# Patient Record
Sex: Female | Born: 1975 | Race: White | Hispanic: No | Marital: Single | Smoking: Current every day smoker
Health system: Southern US, Community
[De-identification: ages and names within clinical notes are randomized; demographics above are authoritative.]

## PROBLEM LIST (undated history)

## (undated) DIAGNOSIS — F1910 Other psychoactive substance abuse, uncomplicated: Secondary | ICD-10-CM

## (undated) DIAGNOSIS — G8929 Other chronic pain: Secondary | ICD-10-CM

## (undated) DIAGNOSIS — K8020 Calculus of gallbladder without cholecystitis without obstruction: Secondary | ICD-10-CM

## (undated) DIAGNOSIS — J449 Chronic obstructive pulmonary disease, unspecified: Secondary | ICD-10-CM

## (undated) DIAGNOSIS — N179 Acute kidney failure, unspecified: Secondary | ICD-10-CM

## (undated) DIAGNOSIS — F1120 Opioid dependence, uncomplicated: Secondary | ICD-10-CM

## (undated) DIAGNOSIS — I10 Essential (primary) hypertension: Secondary | ICD-10-CM

## (undated) HISTORY — PX: ABDOMINAL HYSTERECTOMY: SHX81

## (undated) HISTORY — PX: TUBAL LIGATION: SHX77

## (undated) HISTORY — PX: MOUTH SURGERY: SHX715

---

## 2001-07-24 ENCOUNTER — Other Ambulatory Visit: Admission: RE | Admit: 2001-07-24 | Discharge: 2001-07-24 | Payer: Self-pay | Admitting: *Deleted

## 2002-01-15 ENCOUNTER — Emergency Department (HOSPITAL_COMMUNITY): Admission: EM | Admit: 2002-01-15 | Discharge: 2002-01-15 | Payer: Self-pay | Admitting: Emergency Medicine

## 2002-04-23 ENCOUNTER — Emergency Department (HOSPITAL_COMMUNITY): Admission: EM | Admit: 2002-04-23 | Discharge: 2002-04-23 | Payer: Self-pay | Admitting: *Deleted

## 2002-05-13 ENCOUNTER — Emergency Department (HOSPITAL_COMMUNITY): Admission: EM | Admit: 2002-05-13 | Discharge: 2002-05-13 | Payer: Self-pay | Admitting: *Deleted

## 2002-05-20 ENCOUNTER — Emergency Department (HOSPITAL_COMMUNITY): Admission: EM | Admit: 2002-05-20 | Discharge: 2002-05-20 | Payer: Self-pay | Admitting: *Deleted

## 2002-07-22 ENCOUNTER — Emergency Department (HOSPITAL_COMMUNITY): Admission: EM | Admit: 2002-07-22 | Discharge: 2002-07-22 | Payer: Self-pay | Admitting: Emergency Medicine

## 2002-07-24 ENCOUNTER — Inpatient Hospital Stay (HOSPITAL_COMMUNITY): Admission: RE | Admit: 2002-07-24 | Discharge: 2002-07-25 | Payer: Self-pay | Admitting: *Deleted

## 2002-07-30 ENCOUNTER — Emergency Department (HOSPITAL_COMMUNITY): Admission: EM | Admit: 2002-07-30 | Discharge: 2002-07-30 | Payer: Self-pay | Admitting: *Deleted

## 2002-08-05 ENCOUNTER — Emergency Department (HOSPITAL_COMMUNITY): Admission: EM | Admit: 2002-08-05 | Discharge: 2002-08-05 | Payer: Self-pay | Admitting: Emergency Medicine

## 2002-08-10 ENCOUNTER — Emergency Department (HOSPITAL_COMMUNITY): Admission: EM | Admit: 2002-08-10 | Discharge: 2002-08-10 | Payer: Self-pay | Admitting: Emergency Medicine

## 2002-09-13 ENCOUNTER — Emergency Department (HOSPITAL_COMMUNITY): Admission: EM | Admit: 2002-09-13 | Discharge: 2002-09-14 | Payer: Self-pay | Admitting: Emergency Medicine

## 2002-09-25 ENCOUNTER — Emergency Department (HOSPITAL_COMMUNITY): Admission: EM | Admit: 2002-09-25 | Discharge: 2002-09-25 | Payer: Self-pay | Admitting: Emergency Medicine

## 2002-09-25 ENCOUNTER — Emergency Department (HOSPITAL_COMMUNITY): Admission: EM | Admit: 2002-09-25 | Discharge: 2002-09-25 | Payer: Self-pay | Admitting: *Deleted

## 2002-10-16 ENCOUNTER — Encounter: Admission: RE | Admit: 2002-10-16 | Discharge: 2003-01-14 | Payer: Self-pay

## 2002-11-09 ENCOUNTER — Emergency Department (HOSPITAL_COMMUNITY): Admission: EM | Admit: 2002-11-09 | Discharge: 2002-11-10 | Payer: Self-pay

## 2002-11-27 ENCOUNTER — Emergency Department (HOSPITAL_COMMUNITY): Admission: EM | Admit: 2002-11-27 | Discharge: 2002-11-27 | Payer: Self-pay | Admitting: *Deleted

## 2002-12-10 ENCOUNTER — Emergency Department (HOSPITAL_COMMUNITY): Admission: EM | Admit: 2002-12-10 | Discharge: 2002-12-10 | Payer: Self-pay | Admitting: Emergency Medicine

## 2003-01-08 ENCOUNTER — Emergency Department (HOSPITAL_COMMUNITY): Admission: EM | Admit: 2003-01-08 | Discharge: 2003-01-09 | Payer: Self-pay | Admitting: Emergency Medicine

## 2003-01-21 ENCOUNTER — Encounter: Admission: RE | Admit: 2003-01-21 | Discharge: 2003-01-21 | Payer: Self-pay | Admitting: Internal Medicine

## 2003-01-21 ENCOUNTER — Encounter: Payer: Self-pay | Admitting: Internal Medicine

## 2003-03-17 ENCOUNTER — Emergency Department (HOSPITAL_COMMUNITY): Admission: EM | Admit: 2003-03-17 | Discharge: 2003-03-17 | Payer: Self-pay | Admitting: Emergency Medicine

## 2003-03-27 ENCOUNTER — Emergency Department (HOSPITAL_COMMUNITY): Admission: EM | Admit: 2003-03-27 | Discharge: 2003-03-27 | Payer: Self-pay | Admitting: *Deleted

## 2003-04-30 ENCOUNTER — Emergency Department (HOSPITAL_COMMUNITY): Admission: EM | Admit: 2003-04-30 | Discharge: 2003-04-30 | Payer: Self-pay | Admitting: Emergency Medicine

## 2003-05-02 ENCOUNTER — Emergency Department (HOSPITAL_COMMUNITY): Admission: EM | Admit: 2003-05-02 | Discharge: 2003-05-02 | Payer: Self-pay | Admitting: Emergency Medicine

## 2003-05-02 ENCOUNTER — Encounter: Payer: Self-pay | Admitting: Emergency Medicine

## 2003-05-11 ENCOUNTER — Emergency Department (HOSPITAL_COMMUNITY): Admission: EM | Admit: 2003-05-11 | Discharge: 2003-05-11 | Payer: Self-pay | Admitting: Emergency Medicine

## 2003-05-20 ENCOUNTER — Encounter: Payer: Self-pay | Admitting: Orthopedic Surgery

## 2003-05-20 ENCOUNTER — Ambulatory Visit: Admission: RE | Admit: 2003-05-20 | Discharge: 2003-05-20 | Payer: Self-pay | Admitting: Orthopedic Surgery

## 2003-05-21 ENCOUNTER — Encounter: Payer: Self-pay | Admitting: Orthopedic Surgery

## 2003-10-15 ENCOUNTER — Inpatient Hospital Stay (HOSPITAL_COMMUNITY): Admission: EM | Admit: 2003-10-15 | Discharge: 2003-10-18 | Payer: Self-pay | Admitting: Psychiatry

## 2003-10-21 ENCOUNTER — Emergency Department (HOSPITAL_COMMUNITY): Admission: EM | Admit: 2003-10-21 | Discharge: 2003-10-21 | Payer: Self-pay | Admitting: *Deleted

## 2004-02-18 ENCOUNTER — Inpatient Hospital Stay (HOSPITAL_COMMUNITY): Admission: RE | Admit: 2004-02-18 | Discharge: 2004-02-19 | Payer: Self-pay | Admitting: *Deleted

## 2004-05-03 ENCOUNTER — Emergency Department (HOSPITAL_COMMUNITY): Admission: EM | Admit: 2004-05-03 | Discharge: 2004-05-03 | Payer: Self-pay | Admitting: Emergency Medicine

## 2004-05-22 ENCOUNTER — Emergency Department (HOSPITAL_COMMUNITY): Admission: EM | Admit: 2004-05-22 | Discharge: 2004-05-22 | Payer: Self-pay | Admitting: Emergency Medicine

## 2004-11-28 ENCOUNTER — Emergency Department (HOSPITAL_COMMUNITY): Admission: EM | Admit: 2004-11-28 | Discharge: 2004-11-28 | Payer: Self-pay | Admitting: Emergency Medicine

## 2005-01-11 ENCOUNTER — Emergency Department: Payer: Self-pay | Admitting: Emergency Medicine

## 2005-02-19 ENCOUNTER — Emergency Department (HOSPITAL_COMMUNITY): Admission: EM | Admit: 2005-02-19 | Discharge: 2005-02-19 | Payer: Self-pay | Admitting: *Deleted

## 2006-05-30 ENCOUNTER — Emergency Department (HOSPITAL_COMMUNITY): Admission: EM | Admit: 2006-05-30 | Discharge: 2006-05-31 | Payer: Self-pay | Admitting: Emergency Medicine

## 2006-07-04 ENCOUNTER — Emergency Department (HOSPITAL_COMMUNITY): Admission: EM | Admit: 2006-07-04 | Discharge: 2006-07-04 | Payer: Self-pay | Admitting: Emergency Medicine

## 2006-07-08 ENCOUNTER — Emergency Department (HOSPITAL_COMMUNITY): Admission: EM | Admit: 2006-07-08 | Discharge: 2006-07-08 | Payer: Self-pay | Admitting: Emergency Medicine

## 2007-06-15 ENCOUNTER — Encounter: Admission: RE | Admit: 2007-06-15 | Discharge: 2007-06-15 | Payer: Self-pay | Admitting: Otolaryngology

## 2007-06-20 ENCOUNTER — Ambulatory Visit (HOSPITAL_BASED_OUTPATIENT_CLINIC_OR_DEPARTMENT_OTHER): Admission: RE | Admit: 2007-06-20 | Discharge: 2007-06-20 | Payer: Self-pay | Admitting: Otolaryngology

## 2008-12-17 ENCOUNTER — Encounter: Payer: Self-pay | Admitting: Orthopedic Surgery

## 2008-12-17 ENCOUNTER — Emergency Department (HOSPITAL_COMMUNITY): Admission: EM | Admit: 2008-12-17 | Discharge: 2008-12-17 | Payer: Self-pay | Admitting: Emergency Medicine

## 2009-01-20 ENCOUNTER — Encounter: Admission: RE | Admit: 2009-01-20 | Discharge: 2009-01-20 | Payer: Self-pay | Admitting: Orthopaedic Surgery

## 2009-01-20 ENCOUNTER — Encounter: Payer: Self-pay | Admitting: Orthopedic Surgery

## 2009-03-13 ENCOUNTER — Encounter (HOSPITAL_COMMUNITY): Admission: RE | Admit: 2009-03-13 | Discharge: 2009-04-12 | Payer: Self-pay | Admitting: Orthopaedic Surgery

## 2009-07-24 ENCOUNTER — Encounter: Payer: Self-pay | Admitting: Orthopedic Surgery

## 2009-11-04 ENCOUNTER — Ambulatory Visit (HOSPITAL_COMMUNITY): Admission: RE | Admit: 2009-11-04 | Discharge: 2009-11-04 | Payer: Self-pay | Admitting: General Surgery

## 2009-11-18 ENCOUNTER — Ambulatory Visit: Payer: Self-pay | Admitting: Orthopedic Surgery

## 2009-11-18 DIAGNOSIS — M25569 Pain in unspecified knee: Secondary | ICD-10-CM | POA: Insufficient documentation

## 2010-07-22 ENCOUNTER — Emergency Department (HOSPITAL_COMMUNITY): Admission: EM | Admit: 2010-07-22 | Discharge: 2010-07-22 | Payer: Self-pay | Admitting: Emergency Medicine

## 2010-08-02 ENCOUNTER — Ambulatory Visit: Admission: RE | Admit: 2010-08-02 | Discharge: 2010-08-02 | Payer: Self-pay | Admitting: Neurology

## 2010-11-07 ENCOUNTER — Encounter: Payer: Self-pay | Admitting: Internal Medicine

## 2010-11-07 ENCOUNTER — Encounter: Payer: Self-pay | Admitting: General Surgery

## 2010-11-08 ENCOUNTER — Encounter: Payer: Self-pay | Admitting: Neurology

## 2010-11-08 ENCOUNTER — Encounter: Payer: Self-pay | Admitting: *Deleted
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
## 2010-11-17 NOTE — Progress Notes (Signed)
Summary: Progress note  Progress note   Imported By: Jacklynn Ganong 11/18/2009 09:46:45  _____________________________________________________________________  External Attachment:    Type:   Image     Comment:   External Document

## 2010-11-17 NOTE — Letter (Signed)
Summary: History form  History form   Imported By: Jacklynn Ganong 11/21/2009 08:38:53  _____________________________________________________________________  External Attachment:    Type:   Image     Comment:   External Document

## 2010-12-22 ENCOUNTER — Other Ambulatory Visit: Payer: Self-pay | Admitting: Neurology

## 2010-12-22 DIAGNOSIS — M545 Low back pain: Secondary | ICD-10-CM

## 2010-12-25 ENCOUNTER — Inpatient Hospital Stay: Admission: RE | Admit: 2010-12-25 | Payer: Self-pay | Source: Ambulatory Visit

## 2010-12-30 LAB — DIFFERENTIAL
Eosinophils Absolute: 0.2 10*3/uL (ref 0.0–0.7)
Eosinophils Relative: 2 % (ref 0–5)
Lymphocytes Relative: 42 % (ref 12–46)
Lymphs Abs: 2.9 10*3/uL (ref 0.7–4.0)
Monocytes Absolute: 0.5 10*3/uL (ref 0.1–1.0)

## 2010-12-30 LAB — POCT CARDIAC MARKERS
CKMB, poc: 2.3 ng/mL (ref 1.0–8.0)
Myoglobin, poc: 79.1 ng/mL (ref 12–200)
Troponin i, poc: <0.05 ng/mL (ref 0.00–0.09)
Troponin i, poc: <0.05 ng/mL (ref 0.00–0.09)

## 2010-12-30 LAB — BASIC METABOLIC PANEL
BUN: 7 mg/dL (ref 6–23)
CO2: 28 mEq/L (ref 19–32)
Chloride: 105 mEq/L (ref 96–112)
GFR calc Af Amer: >60 mL/min (ref >60)
Potassium: 3.7 mEq/L (ref 3.5–5.1)

## 2010-12-30 LAB — CBC
HCT: 38.2 % (ref 36.0–46.0)
MCH: 29.5 pg (ref 26.0–34.0)
MCV: 87.2 fL (ref 78.0–100.0)
RBC: 4.38 MIL/uL (ref 3.87–5.11)
WBC: 6.9 10*3/uL (ref 4.0–10.5)

## 2010-12-31 ENCOUNTER — Emergency Department (HOSPITAL_COMMUNITY)
Admission: EM | Admit: 2010-12-31 | Discharge: 2010-12-31 | Disposition: A | Discharge status: Home / Self Care | Payer: Medicaid Other | Attending: Emergency Medicine | Admitting: Emergency Medicine

## 2010-12-31 ENCOUNTER — Emergency Department (HOSPITAL_COMMUNITY): Payer: Medicaid Other

## 2010-12-31 DIAGNOSIS — I10 Essential (primary) hypertension: Secondary | ICD-10-CM | POA: Insufficient documentation

## 2010-12-31 DIAGNOSIS — F411 Generalized anxiety disorder: Secondary | ICD-10-CM | POA: Insufficient documentation

## 2010-12-31 DIAGNOSIS — Z79899 Other long term (current) drug therapy: Secondary | ICD-10-CM | POA: Insufficient documentation

## 2010-12-31 DIAGNOSIS — J4489 Other specified chronic obstructive pulmonary disease: Secondary | ICD-10-CM | POA: Insufficient documentation

## 2010-12-31 DIAGNOSIS — S0003XA Contusion of scalp, initial encounter: Secondary | ICD-10-CM | POA: Insufficient documentation

## 2010-12-31 DIAGNOSIS — S0990XA Unspecified injury of head, initial encounter: Secondary | ICD-10-CM | POA: Insufficient documentation

## 2010-12-31 DIAGNOSIS — J449 Chronic obstructive pulmonary disease, unspecified: Secondary | ICD-10-CM | POA: Insufficient documentation

## 2011-03-02 NOTE — Op Note (Signed)
NAMEARDELLE, HALIBURTON               ACCOUNT NO.:  [id number]   MEDICAL RECORD NO.:  [id number]          PATIENT TYPE:  AMB   LOCATION:  DSC                          FACILITY:  [facility]   PHYSICIAN:  Christopher E. Ezzard Standing, M.D.DATE OF BIRTH:  [birth year]   DATE OF PROCEDURE:  06/20/2007  DATE OF DISCHARGE:                               OPERATIVE REPORT   PREOPERATIVE DIAGNOSIS:  Secondary bilateral choanal atresia.   POSTOPERATIVE DIAGNOSIS:  Secondary bilateral choanal atresia.   OPERATION:  Repair of choanal atresia with placement of Silastic splint.   SURGEON:  Narda Bonds, MD   ANESTHESIA:  General endotracheal.   COMPLICATIONS:  None.   BRIEF CLINICAL NOTE:  Nancy Lynn is a 35 year old female who has had  history of significant drug abuse, especially in the nose where she has  used cocaine as well Tylox intranasally.  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  She has had chronic mucus drainage from  her nose.  She was attempted to have a nasal airway placed while having  some dental procedures done and had no nasal airway.  On exam in the  office, she has complete atresia of the choanae bilaterally with scar  tissue over the soft palate, stuck up against the nasopharyngeal soft  tissue.  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  She  is taken to the operating room at this time for repair of choanal  atresia and placement of a splint to keep this open.   DESCRIPTION OF PROCEDURE:  After adequate endotracheal anesthesia, the  patient received 1 g of Ancef IV preoperatively.  Next, the 0 sinus  scope was used to evaluate the nasal cavity.  The patient had total  obstruction of the posterior nasal cavity in the choanal area.  There  was just scar tissue.  Using a large right-angle hemostat, this was  placed behind the uvula and soft palate and a passageway was created  with the right-angle hemostat.  This was enlarged with blunt dissection  as  well as passing a rubber catheter through there and dilating the  opening.  Finally, using cautery, a portion of the scar tissue was cut  and removed and the choanae were opened up to approximately 2 to 2.5-cm-  wide opening.  Hemostasis was obtained with suction cautery.  At the end  of the case, a 0.4-mm sheeting of reinforced Silastic was placed, cut to  appropriate size and placed in the nasopharynx; this was secured to the  septum with two 3-0 nylon sutures.  This completed the procedure.  Photos were obtained.  The patient was awoken from anesthesia and  transferred to the recovery room postop doing well.   DISPOSITION:  Kerby is discharged home later this morning on her  regular medications as well as Tylenol p.r.n. pain and amoxicillin 875  b.i.d. for 10 days.  We will have her follow up in my office in 1 week  for recheck.          ______________________________  Kristine Garbe. Ezzard Standing, M.D.    CEN/MEDQ  D:  06/20/2007  T:  06/20/2007  Job:  [id number]
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
## 2011-03-05 NOTE — Consult Note (Signed)
Nancy Lynn, Nancy Lynn                           ACCOUNT NO.:  [id number]   MEDICAL RECORD NO.:  [id number]                   PATIENT TYPE:   LOCATION:                                       FACILITY:   PHYSICIAN:  Zachary George, DO                      DATE OF BIRTH:  11-Mar-1976   DATE OF CONSULTATION:  11/02/2002  DATE OF DISCHARGE:                                   CONSULTATION   REASON FOR CONSULTATION:  The patient returns to the clinic today for re-  evaluation.  She was initially seen on October 19, 2002 for chronic lower  back pain.  She was prescribed Tylox 5/500 mg to take three times a day as  needed, and I gave her a prescription for 60 pills.  She has already used up  all of her pills and in addition she has received Oxycodone four pills from  her primary care physician which is in violation with our controlled  substance agreement.  She is counseled extensively on our controlled  substance agreement that she had signed at initial visit.  In addition, I  had ordered an MRI of her lumbar spine and recommended physical therapy and  gave her a prescription.  She has neither gotten her MRI nor begun physical  therapy stating that her grandfather is in the hospital and her kids have a  lot going on.  She also continues to smoke one and a half packs of  cigarettes per day.  She has fairly poor health characteristics and I  counseled her on these as well.  In addition, I obtained a urine drug screen  at initial visit which was positive for propoxyphene which the patient had  not disclosed.  She states that she had gotten some Darvocet from her  grandmother for tooth pain.  Currently, her pain is 9/10 on a subjective  scale.  Her function of quality of life indices remain declined. Her sleep  is poor.  She was unable to tolerate Mobic stating that it made her sick to  her stomach.  She is apologetic about not getting her MRI and starting  physical therapy.  However, she asks for more  time to get this done  secondary to all of the things that have been going on in her life recently.  In addition, she awaits getting her teeth pulled.   PHYSICAL EXAMINATION:  GENERAL:  Physical examination reveals an obese  female in no acute distress.  BACK:  Examination of other back reveals increased lumbar lordosis with  slightly exaggerated response to very light palpation of the lumbar  paraspinal muscles.  Range of motion is full in all planes.  Manual muscle  testing is 5/5 bilateral lower extremities.  Sensory exam is intact to light  touch in the bilateral lower extremities.  Muscle stretch reflexes are 2+/4  bilateral patella and  Achilles and 0/4 bilateral medial hamstrings.   IMPRESSION:  1. Chronic low back pain, mechanical and myofascial.  2. Obesity.  3. Tobacco abuse.  4. Poor health characteristics.   PLAN:  1. I discussed further treatment options with the patient.  She has been non-     compliant over the past two weeks with the treatment plan.  She has been     non-complaint not only with her medications but also with therapy and     further diagnostic workup.  She has been counseled in regards to the     controlled substance agreement as well as using other people's scheduled     medications.  I informed her that I am unable to refill her medications     early and that she may ultimately be discharged from our clinic if she     continues to violate the controlled substance agreement and continues     with her non-compliance.  I would like for her to begin physical therapy     and get her MRI of the lumbar spine.  2. In terms of medications, we will start Naprosyn 500 mg one p.o. b.i.d.     with food, #60 without refills.  3. While she is going through therapy we will continue with Tylox 5/500 mg     one p.o. t.i.d. as needed, #60 without refills.  This is not to be filled     until on or after November 07, 2002, the date at which her previous     prescription  would have run out.  I have counseled her extensively on     following with the plan and not to take more medications than prescribed.     I do not see long term use of narcotics warranted in this 35 year-old     female.  4. I counseled her on positive health characteristics and encouraged her to     quit smoking and lose weight for best outcome.  5. We will obtain a urine drug screen with a full informed consent.  6. The patient is to return to the clinic in three weeks for re-evaluation.  7. The patient was educated about the findings and recommendations and     understands.  No barriers to communication.                                               Zachary George, DO    JW/MEDQ  D:  11/02/2002  T:  11/03/2002  Job:  [id number]   cc:   Elliot Cousin, M.D.  [address]  [place]  [place] [address]  Fax: [phone]

## 2011-03-05 NOTE — Discharge Summary (Signed)
Nancy Lynn, Nancy Lynn                         ACCOUNT NO.:  [id number]   MEDICAL RECORD NO.:  [id number]                   PATIENT TYPE:  INP   LOCATION:  A404                                 FACILITY:  [facility]   PHYSICIAN:  Langley Gauss, M.D.                DATE OF BIRTH:  24-Sep-1976   DATE OF ADMISSION:  02/18/2004  DATE OF DISCHARGE:  02/19/2004                                 DISCHARGE SUMMARY   DIAGNOSES:  1. Chronic pelvic pain, right greater than left.  2. History of prior total abdominal hysterectomy.   PROCEDURE PERFORMED:  Laparoscopy with lysis of adhesions.  I was unable to  remove the ovaries secondary to the extensive adhesion disease encountered,  and the likely retro-peritonization of the adnexa.   DISPOSITION:  The patient was given instructions to follow up in the office  in one week's time for removal of the staples from the laparoscopic  incisions.   DISCHARGE MEDICATIONS:  Tylox two every four to six hours p.r.n.  postoperative pain, #60 with no refill.   LABORATORY STUDIES:  Admission hemoglobin and hematocrit 14.2/42.5 with a  white count of 11.3.  Postoperative day #1, hemoglobin 12.8, and hematocrit  37.6 with a white count of 12.5, O positive blood type.  FSH is ordered for  Feb 19, 2004.  Results of this are not available.   HOSPITAL COURSE:  The patient was referred through the ambulatory surgical  unit on Feb 18, 2003.  Laparoscopic procedure performed without  complications.  Extensive adhesive disease encountered.  Three hours of  dissection required; nevertheless, visualization of the adnexa was not  possible.  Postoperatively, the patient did well.  She did request removal  of the Foley catheter on the afternoon of surgery.  Subsequently she was  able to ambulate and void without difficulty.  She remained afebrile with  stable vital signs.  She was treated with IV Buprenex for pain relief as  well as p.o. Tylox.   Operative findings were  discussed with the patient at the time of discharge  on Feb 19, 2004, to include the difficulty of the dissection, and the  inability to safely excise the ovaries.  I did discuss with the patient that  should removal of ovaries bilaterally be required in the future, this would  need to be done by proceeding with open laparotomy.     ___________________________________________                                         Langley Gauss, M.D.   DC/MEDQ  D:  02/19/2004  T:  02/19/2004  Job:  [id number]

## 2011-07-30 LAB — CBC
MCHC: 34.3
MCV: 84
Platelets: 253
RDW: 13.1

## 2011-09-07 ENCOUNTER — Emergency Department (HOSPITAL_COMMUNITY)
Admission: EM | Admit: 2011-09-07 | Discharge: 2011-09-07 | Disposition: A | Discharge status: Home / Self Care | Payer: Medicaid Other | Attending: Emergency Medicine | Admitting: Emergency Medicine

## 2011-09-07 ENCOUNTER — Encounter: Payer: Self-pay | Admitting: *Deleted

## 2011-09-07 DIAGNOSIS — M25569 Pain in unspecified knee: Secondary | ICD-10-CM | POA: Insufficient documentation

## 2011-09-07 DIAGNOSIS — M25561 Pain in right knee: Secondary | ICD-10-CM

## 2011-09-07 DIAGNOSIS — Z6841 Body Mass Index (BMI) 40.0 and over, adult: Secondary | ICD-10-CM | POA: Insufficient documentation

## 2011-09-07 DIAGNOSIS — F17200 Nicotine dependence, unspecified, uncomplicated: Secondary | ICD-10-CM | POA: Insufficient documentation

## 2011-09-07 DIAGNOSIS — Z9079 Acquired absence of other genital organ(s): Secondary | ICD-10-CM | POA: Insufficient documentation

## 2011-09-07 DIAGNOSIS — J4489 Other specified chronic obstructive pulmonary disease: Secondary | ICD-10-CM | POA: Insufficient documentation

## 2011-09-07 DIAGNOSIS — I10 Essential (primary) hypertension: Secondary | ICD-10-CM | POA: Insufficient documentation

## 2011-09-07 DIAGNOSIS — J449 Chronic obstructive pulmonary disease, unspecified: Secondary | ICD-10-CM | POA: Insufficient documentation

## 2011-09-07 HISTORY — DX: Chronic obstructive pulmonary disease, unspecified: J44.9

## 2011-09-07 HISTORY — DX: Essential (primary) hypertension: I10

## 2011-09-07 MED ORDER — OXYCODONE-ACETAMINOPHEN 5-325 MG PO TABS
2.0000 | ORAL_TABLET | Freq: Once | ORAL | Status: AC
  Administered 2011-09-07: 2 via ORAL
  Filled 2011-09-07: qty 2

## 2011-09-07 MED ORDER — NAPROXEN 250 MG PO TABS
500.0000 mg | ORAL_TABLET | Freq: Once | ORAL | Status: AC
  Administered 2011-09-07: 500 mg via ORAL
  Filled 2011-09-07: qty 2

## 2011-09-07 MED ORDER — OXYCODONE-ACETAMINOPHEN 5-325 MG PO TABS: 1.0000 | ORAL_TABLET | ORAL | Status: AC | PRN

## 2011-09-07 NOTE — ED Provider Notes (Signed)
History     CSN: [id number] Arrival date & time: 09/07/2011  8:18 PM   First MD Initiated Contact with Patient 09/07/11 2026      Chief Complaint  Patient presents with  . Knee Pain    (Consider location/radiation/quality/duration/timing/severity/associated sxs/prior treatment) HPI Comments: Patient with hx of chronic bilateral knee pain c/o increasing pain for several weeks.  States she has seen Dr. Romeo Apple for same and was told that she needed knee replacements but would need to lose weight first.  States her pain is worse with standing and bending her knees.  Also reports having intermittent swelling of her knees.  Pain improves slightly with rest.  She denies recent injury, redness or fever.  She states that her current pain medications are not controlling the pain.    Patient is a 35 y.o. female presenting with knee pain. The history is provided by the patient.  Knee Pain This is a chronic problem. The current episode started 1 to 4 weeks ago. The problem occurs constantly. The problem has been gradually worsening. Associated symptoms include arthralgias. Pertinent negatives include no abdominal pain, chest pain, chills, coughing, fatigue, fever, joint swelling, myalgias, nausea, neck pain, numbness, rash, sore throat, swollen glands, urinary symptoms, vomiting or weakness. The symptoms are aggravated by walking and standing. Treatments tried: her own medications. The treatment provided no relief.    Past Medical History  Diagnosis Date  . COPD (chronic obstructive pulmonary disease)   . Hypertension     Past Surgical History  Procedure Date  . Cesarean section   . Abdominal hysterectomy   . Tubal ligation     Family History  Problem Relation Age of Onset  . Diabetes Mother   . Hypertension Father     History  Substance Use Topics  . Smoking status: Current Everyday Smoker -- 1.0 packs/day    Types: Cigarettes  . Smokeless tobacco: Not on file  . Alcohol Use: Yes      OB History    Grav Para Term Preterm Abortions TAB SAB Ect Mult Living                  Review of Systems  Constitutional: Negative for fever, chills and fatigue.  HENT: Negative for sore throat, trouble swallowing, neck pain and neck stiffness.   Respiratory: Negative for cough, shortness of breath and wheezing.   Cardiovascular: Negative for chest pain and palpitations.  Gastrointestinal: Negative for nausea, vomiting and abdominal pain.  Genitourinary: Negative for dysuria, hematuria and flank pain.  Musculoskeletal: Positive for arthralgias. Negative for myalgias, back pain, joint swelling and gait problem.  Skin: Negative.  Negative for rash.  Neurological: Negative for dizziness, weakness and numbness.  Hematological: Does not bruise/bleed easily.  All other systems reviewed and are negative.    Allergies  Review of patient's allergies indicates no known allergies.  Home Medications   Current Outpatient Rx  Name Route Sig Dispense Refill  . ALBUTEROL SULFATE HFA 108 (90 BASE) MCG/ACT IN AERS Inhalation Inhale 2 puffs into the lungs daily as needed. For shortness of breath     . ALPRAZOLAM 1 MG PO TABS Oral Take 1 mg by mouth 3 (three) times daily.      Marland Kitchen DEXTROMETHORPHAN-GUAIFENESIN 30-600 MG PO TB12 Oral Take 1 tablet by mouth daily as needed. For congestion     . FLUTICASONE-SALMETEROL 250-50 MCG/DOSE IN AEPB Inhalation Inhale 1 puff into the lungs every 12 (twelve) hours.      . FUROSEMIDE 40  MG PO TABS Oral Take 40 mg by mouth daily.      Marland Kitchen GABAPENTIN 600 MG PO TABS Oral Take 600 mg by mouth 3 (three) times daily.      . IBUPROFEN 800 MG PO TABS Oral Take 800 mg by mouth every 8 (eight) hours as needed. For pain     . LISINOPRIL 20 MG PO TABS Oral Take 20 mg by mouth daily.      Marland Kitchen POTASSIUM CHLORIDE CRYS CR 20 MEQ PO TBCR Oral Take 20 mEq by mouth daily.      . TOPIRAMATE 100 MG PO TABS Oral Take 100 mg by mouth daily.      . TRAMADOL HCL 50 MG PO TABS Oral Take  50-100 mg by mouth every 6 (six) hours as needed. For painMaximum dose= 8 tablets per day       BP 136/88  Pulse 104  Temp(Src) 97.7 F (36.5 C) (Oral)  Resp 20  Ht 5\' 7"  (1.702 m)  Wt 315 lb (142.883 kg)  BMI 49.34 kg/m2  SpO2 100%  Physical Exam  Nursing note and vitals reviewed. Constitutional: She is oriented to person, place, and time. She appears well-developed and well-nourished. No distress.       Morbidly obese  HENT:  Head: Normocephalic and atraumatic.  Mouth/Throat: Oropharynx is clear and moist.  Cardiovascular: Normal rate, regular rhythm and normal heart sounds.   Pulmonary/Chest: Effort normal and breath sounds normal. No respiratory distress. She exhibits no tenderness.  Musculoskeletal: Normal range of motion. She exhibits tenderness. She exhibits no edema.       Right knee: She exhibits normal range of motion, no swelling, no effusion, no ecchymosis, no deformity, no laceration, no erythema, no LCL laxity and normal patellar mobility. tenderness found. No medial joint line, no lateral joint line and no patellar tendon tenderness noted.       Left knee: She exhibits normal range of motion, no swelling, no effusion, no ecchymosis, no deformity, no erythema and normal alignment. tenderness found. No medial joint line, no lateral joint line and no patellar tendon tenderness noted.       Moderate patellar crepitus bilaterally, patellar tendons intact.  Pt has full ROM of the bilateral knee, no effusion, or ligament instabilty  Neurological: She is alert and oriented to person, place, and time. She has normal reflexes. No cranial nerve deficit. She exhibits normal muscle tone. Coordination normal.  Skin: Skin is warm and dry.  Psychiatric: She has a normal mood and affect.    ED Course  Procedures (including critical care time)       MDM     9:13 PM patient has moderate crepitus to bilateral knees.  No effusion, erythema or obvious ligament instability.   Ambulated to restroom without difficulty.  I have reviewed pt's previous medical records.  According to previous orthopedic evaluation from 2011, pt was scheduled for PT and to return after weight loss.     I will address her pain issue and she agrees to follow-up with her PMD for further management of her chronic pain.    Pt feels improved after observation and/or treatment in ED.   Patient / Family / Caregiver understand and agree with initial ED impression and plan with expectations set for ED visit.     Kanetra Ho L. [place], [place] 09/07/11 2126

## 2011-09-07 NOTE — ED Notes (Signed)
Bil knee pain  , needs knee replacements.  Seen by Dr Romeo Apple.  Needs to lose wt prior to surgery

## 2011-09-08 NOTE — ED Provider Notes (Signed)
Medical screening examination/treatment/procedure(s) were performed by non-physician practitioner and as supervising physician I was immediately available for consultation/collaboration.   Ece Cumberland L Kirtan Sada, MD 09/08/11 2326 

## 2011-10-27 ENCOUNTER — Emergency Department (HOSPITAL_COMMUNITY): Payer: Medicaid Other

## 2011-10-27 ENCOUNTER — Emergency Department (HOSPITAL_COMMUNITY)
Admission: EM | Admit: 2011-10-27 | Discharge: 2011-10-27 | Disposition: A | Discharge status: Home / Self Care | Payer: Medicaid Other | Attending: Emergency Medicine | Admitting: Emergency Medicine

## 2011-10-27 ENCOUNTER — Encounter (HOSPITAL_COMMUNITY): Payer: Self-pay | Admitting: *Deleted

## 2011-10-27 DIAGNOSIS — J209 Acute bronchitis, unspecified: Secondary | ICD-10-CM | POA: Insufficient documentation

## 2011-10-27 DIAGNOSIS — F17200 Nicotine dependence, unspecified, uncomplicated: Secondary | ICD-10-CM | POA: Insufficient documentation

## 2011-10-27 DIAGNOSIS — I10 Essential (primary) hypertension: Secondary | ICD-10-CM | POA: Insufficient documentation

## 2011-10-27 DIAGNOSIS — Z9079 Acquired absence of other genital organ(s): Secondary | ICD-10-CM | POA: Insufficient documentation

## 2011-10-27 MED ORDER — IPRATROPIUM BROMIDE 0.02 % IN SOLN
0.5000 mg | Freq: Once | RESPIRATORY_TRACT | Status: AC
  Administered 2011-10-27: 0.5 mg via RESPIRATORY_TRACT
  Filled 2011-10-27: qty 2.5

## 2011-10-27 MED ORDER — PREDNISONE 20 MG PO TABS: 60.0000 mg | ORAL_TABLET | Freq: Every day | ORAL | Status: DC

## 2011-10-27 MED ORDER — OXYCODONE-ACETAMINOPHEN 5-325 MG PO TABS: 1.0000 | ORAL_TABLET | ORAL | Status: AC | PRN

## 2011-10-27 MED ORDER — ALBUTEROL SULFATE (5 MG/ML) 0.5% IN NEBU
5.0000 mg | INHALATION_SOLUTION | Freq: Once | RESPIRATORY_TRACT | Status: AC
  Administered 2011-10-27: 5 mg via RESPIRATORY_TRACT
  Filled 2011-10-27: qty 1

## 2011-10-27 MED ORDER — OXYCODONE-ACETAMINOPHEN 5-325 MG PO TABS
2.0000 | ORAL_TABLET | Freq: Once | ORAL | Status: AC
  Administered 2011-10-27: 2 via ORAL
  Filled 2011-10-27: qty 2

## 2011-10-27 MED ORDER — ALBUTEROL SULFATE (5 MG/ML) 0.5% IN NEBU: 5.0000 mg | INHALATION_SOLUTION | Freq: Four times a day (QID) | RESPIRATORY_TRACT | Status: DC | PRN

## 2011-10-27 MED ORDER — PREDNISONE 20 MG PO TABS
60.0000 mg | ORAL_TABLET | Freq: Once | ORAL | Status: AC
  Administered 2011-10-27: 60 mg via ORAL
  Filled 2011-10-27: qty 3

## 2011-10-27 NOTE — ED Notes (Signed)
Alert, frequent cough, congested.

## 2011-10-27 NOTE — ED Notes (Signed)
Cough, congestion, fever

## 2011-10-28 NOTE — ED Provider Notes (Signed)
History     CSN: [id number]  Arrival date & time 10/27/11  1227   First MD Initiated Contact with Patient 10/27/11 1359      Chief Complaint  Patient presents with  . Cough    (Consider location/radiation/quality/duration/timing/severity/associated sxs/prior treatment) Patient is a 36 y.o. female presenting with cough. The history is provided by the patient.  Cough This is a new problem. The current episode started more than 2 days ago. The problem occurs every few minutes. The problem has not changed since onset.The cough is productive of sputum. The maximum temperature recorded prior to her arrival was 100 to 100.9 F. The fever has been present for 1 to 2 days. Associated symptoms include chest pain, chills, rhinorrhea, shortness of breath and wheezing. Pertinent negatives include no headaches and no sore throat. Associated symptoms comments: Chest pain is burning pain when she coughs,  midsternal.. Treatments tried: albuterol inhaler and mucinex DM. The treatment provided mild relief. She is a smoker. Her past medical history is significant for bronchitis and asthma.    Past Medical History  Diagnosis Date  . COPD (chronic obstructive pulmonary disease)   . Hypertension     Past Surgical History  Procedure Date  . Cesarean section   . Abdominal hysterectomy   . Tubal ligation     Family History  Problem Relation Age of Onset  . Diabetes Mother   . Hypertension Father     History  Substance Use Topics  . Smoking status: Current Everyday Smoker -- 1.0 packs/day    Types: Cigarettes  . Smokeless tobacco: Not on file  . Alcohol Use: Yes    OB History    Grav Para Term Preterm Abortions TAB SAB Ect Mult Living                  Review of Systems  Constitutional: Positive for chills. Negative for fever.  HENT: Positive for rhinorrhea. Negative for congestion, sore throat and neck pain.   Eyes: Negative.   Respiratory: Positive for cough, shortness of breath and  wheezing. Negative for chest tightness.   Cardiovascular: Positive for chest pain.  Gastrointestinal: Negative for nausea and abdominal pain.  Genitourinary: Negative.   Musculoskeletal: Negative for joint swelling and arthralgias.  Skin: Negative.  Negative for rash and wound.  Neurological: Negative for dizziness, weakness, light-headedness, numbness and headaches.  Hematological: Negative.   Psychiatric/Behavioral: Negative.     Allergies  Latex  Home Medications   Current Outpatient Rx  Name Route Sig Dispense Refill  . ALBUTEROL SULFATE HFA 108 (90 BASE) MCG/ACT IN AERS Inhalation Inhale 2 puffs into the lungs daily as needed. For shortness of breath     . ALPRAZOLAM 1 MG PO TABS Oral Take 1 mg by mouth 3 (three) times daily.      Marland Kitchen DM-GUAIFENESIN ER 30-600 MG PO TB12 Oral Take 1 tablet by mouth daily as needed. For congestion     . FLUTICASONE-SALMETEROL 250-50 MCG/DOSE IN AEPB Inhalation Inhale 1 puff into the lungs every 12 (twelve) hours.      . FUROSEMIDE 40 MG PO TABS Oral Take 40 mg by mouth daily.      Marland Kitchen GABAPENTIN 600 MG PO TABS Oral Take 600 mg by mouth 3 (three) times daily.      . IBUPROFEN 800 MG PO TABS Oral Take 800 mg by mouth every 8 (eight) hours as needed. For pain     . LISINOPRIL 20 MG PO TABS Oral Take 20 mg  by mouth daily.      . OXYCODONE-ACETAMINOPHEN 5-325 MG PO TABS Oral Take 1 tablet by mouth every 4 (four) hours as needed. Pain    . POTASSIUM CHLORIDE CRYS ER 20 MEQ PO TBCR Oral Take 20 mEq by mouth daily.      . TRAMADOL HCL 50 MG PO TABS Oral Take 50-100 mg by mouth every 6 (six) hours as needed. For painMaximum dose= 8 tablets per day    . ALBUTEROL SULFATE (5 MG/ML) 0.5% IN NEBU Nebulization Take 1 mL (5 mg total) by nebulization every 6 (six) hours as needed for wheezing. 20 mL 0  . OXYCODONE-ACETAMINOPHEN 5-325 MG PO TABS Oral Take 1 tablet by mouth every 4 (four) hours as needed for pain. 20 tablet 0  . PREDNISONE 20 MG PO TABS Oral Take 3  tablets (60 mg total) by mouth daily. 12 tablet 0    BP 123/74  Pulse 84  Temp(Src) 97.8 F (36.6 C) (Oral)  Resp 22  Ht 5\' 7"  (1.702 m)  Wt 321 lb (145.605 kg)  BMI 50.28 kg/m2  SpO2 96%  Physical Exam  Nursing note and vitals reviewed. Constitutional: She is oriented to person, place, and time. She appears well-developed and well-nourished.  HENT:  Head: Normocephalic and atraumatic.  Mouth/Throat: Oropharynx is clear and moist.  Eyes: Conjunctivae are normal.  Neck: Normal range of motion.  Cardiovascular: Normal rate, regular rhythm, normal heart sounds and intact distal pulses.   Pulmonary/Chest: She is in respiratory distress. She has wheezes.       Frequent coarse coughing.  Wheezing throughout   Abdominal: Soft. Bowel sounds are normal. There is no tenderness.  Musculoskeletal: Normal range of motion.  Neurological: She is alert and oriented to person, place, and time.  Skin: Skin is warm and dry.  Psychiatric: She has a normal mood and affect.    ED Course  Procedures (including critical care time)  Labs Reviewed - No data to display Dg Chest 2 View  10/27/2011  *RADIOLOGY REPORT*  Clinical Data: Cough and congestion.  History of COPD.  CHEST - 2 VIEW  Comparison: PA and lateral chest 06/15/2007 and single view of the chest 07/22/2010.  Findings: The lungs are clear. Peribronchial thickening is identified.  Heart size is normal.  No pneumothorax or pleural effusion.  No focal bony abnormality.  IMPRESSION: No acute disease.  Bronchitic change noted.  Original Report Authenticated By: Bernadene Bell. D'ALESSIO, M.D.     1. Bronchitis, acute, with bronchospasm     Pt tx with albuteral/atrovent neb.  Prednisone 60 PO.  Significant improvement in wheeze and cough at dc.  Percocet for cough suppression.  MDM          Candis Musa, PA 10/28/11 2314

## 2011-10-29 NOTE — ED Provider Notes (Signed)
Medical screening examination/treatment/procedure(s) were performed by non-physician practitioner and as supervising physician I was immediately available for consultation/collaboration.  Anaisa Radi S. Oswell Say, MD 10/29/11 0542 

## 2011-11-01 ENCOUNTER — Emergency Department (HOSPITAL_COMMUNITY)
Admission: EM | Admit: 2011-11-01 | Discharge: 2011-11-01 | Disposition: A | Discharge status: Home / Self Care | Payer: Medicaid Other | Attending: Emergency Medicine | Admitting: Emergency Medicine

## 2011-11-01 ENCOUNTER — Encounter (HOSPITAL_COMMUNITY): Payer: Self-pay | Admitting: *Deleted

## 2011-11-01 DIAGNOSIS — Z9851 Tubal ligation status: Secondary | ICD-10-CM | POA: Insufficient documentation

## 2011-11-01 DIAGNOSIS — Z9079 Acquired absence of other genital organ(s): Secondary | ICD-10-CM | POA: Insufficient documentation

## 2011-11-01 DIAGNOSIS — I10 Essential (primary) hypertension: Secondary | ICD-10-CM | POA: Insufficient documentation

## 2011-11-01 DIAGNOSIS — J441 Chronic obstructive pulmonary disease with (acute) exacerbation: Secondary | ICD-10-CM

## 2011-11-01 DIAGNOSIS — F17200 Nicotine dependence, unspecified, uncomplicated: Secondary | ICD-10-CM | POA: Insufficient documentation

## 2011-11-01 MED ORDER — DOXYCYCLINE HYCLATE 100 MG PO CAPS: 100.0000 mg | ORAL_CAPSULE | Freq: Two times a day (BID) | ORAL | Status: AC

## 2011-11-01 MED ORDER — DEXAMETHASONE SODIUM PHOSPHATE 4 MG/ML IJ SOLN
8.0000 mg | Freq: Once | INTRAMUSCULAR | Status: AC
  Administered 2011-11-01: 8 mg via INTRAMUSCULAR
  Filled 2011-11-01: qty 2

## 2011-11-01 MED ORDER — DEXAMETHASONE 4 MG PO TABS: ORAL_TABLET | ORAL | Status: AC

## 2011-11-01 MED ORDER — HYDROCOD POLST-CHLORPHEN POLST 10-8 MG/5ML PO LQCR
5.0000 mL | Freq: Once | ORAL | Status: AC
  Administered 2011-11-01: 5 mL via ORAL
  Filled 2011-11-01: qty 5

## 2011-11-01 MED ORDER — MORPHINE SULFATE 10 MG/ML IJ SOLN
8.0000 mg | Freq: Once | INTRAMUSCULAR | Status: AC
  Administered 2011-11-01: 8 mg via INTRAMUSCULAR
  Filled 2011-11-01: qty 1

## 2011-11-01 MED ORDER — DOXYCYCLINE HYCLATE 100 MG PO TABS
100.0000 mg | ORAL_TABLET | Freq: Once | ORAL | Status: AC
  Administered 2011-11-01: 100 mg via ORAL
  Filled 2011-11-01: qty 1

## 2011-11-01 MED ORDER — OXYCODONE-ACETAMINOPHEN 5-325 MG PO TABS: 1.0000 | ORAL_TABLET | ORAL | Status: AC | PRN

## 2011-11-01 MED ORDER — ALBUTEROL (5 MG/ML) CONTINUOUS INHALATION SOLN
10.0000 mg/h | INHALATION_SOLUTION | Freq: Once | RESPIRATORY_TRACT | Status: AC
  Administered 2011-11-01: 10 mg/h via RESPIRATORY_TRACT
  Filled 2011-11-01: qty 20

## 2011-11-01 NOTE — ED Provider Notes (Signed)
Medical screening examination/treatment/procedure(s) were performed by non-physician practitioner and as supervising physician I was immediately available for consultation/collaboration.  Yee Gangi, MD 11/01/11 2242 

## 2011-11-01 NOTE — ED Provider Notes (Signed)
History     CSN: [id number]  Arrival date & time 11/01/11  1734   None     Chief Complaint  Patient presents with  . Cough  . URI    (Consider location/radiation/quality/duration/timing/severity/associated sxs/prior treatment) HPI Comments: Patient presents with approximately two-week history of cough and congestion. She was seen in the emergency department when a half weeks ago treated with 3 day course of steroid and albuterol nebulizer solutions. The patient states that she is having some technical problems with her nebulizer at home and she is not sure if she's getting the full benefit of her albuterol. She states that his son is the prednisone ran out she began to feel worse. She now states she has a very deep cough a lot of nasal congestion bodyaches wheezing difficulty breathing and generally not feeling well. She requests assistance with this problem. She's not had hemoptysis she has not had syncopal episodes.  Patient is a 36 y.o. female presenting with cough and URI. The history is provided by the patient.  Cough Associated symptoms include myalgias and wheezing. Pertinent negatives include no chest pain and no shortness of breath.  URI The primary symptoms include cough, wheezing and myalgias. Primary symptoms do not include abdominal pain or arthralgias.    Past Medical History  Diagnosis Date  . COPD (chronic obstructive pulmonary disease)   . Hypertension     Past Surgical History  Procedure Date  . Cesarean section   . Abdominal hysterectomy   . Tubal ligation     Family History  Problem Relation Age of Onset  . Diabetes Mother   . Hypertension Father     History  Substance Use Topics  . Smoking status: Current Everyday Smoker -- 1.0 packs/day    Types: Cigarettes  . Smokeless tobacco: Not on file  . Alcohol Use: Yes    OB History    Grav Para Term Preterm Abortions TAB SAB Ect Mult Living                  Review of Systems  Constitutional:  Negative for activity change.       All ROS Neg except as noted in HPI  HENT: Positive for postnasal drip. Negative for nosebleeds and neck pain.   Eyes: Negative for photophobia and discharge.  Respiratory: Positive for cough and wheezing. Negative for shortness of breath.   Cardiovascular: Negative for chest pain and palpitations.  Gastrointestinal: Negative for abdominal pain and blood in stool.  Genitourinary: Negative for dysuria, frequency and hematuria.  Musculoskeletal: Positive for myalgias. Negative for back pain and arthralgias.  Skin: Negative.   Neurological: Negative for dizziness, seizures and speech difficulty.  Psychiatric/Behavioral: Negative for hallucinations and confusion.    Allergies  Latex  Home Medications   Current Outpatient Rx  Name Route Sig Dispense Refill  . ALBUTEROL SULFATE (5 MG/ML) 0.5% IN NEBU Nebulization Take 1 mL (5 mg total) by nebulization every 6 (six) hours as needed for wheezing. 20 mL 0  . ALBUTEROL SULFATE HFA 108 (90 BASE) MCG/ACT IN AERS Inhalation Inhale 2 puffs into the lungs daily as needed. For shortness of breath     . ALPRAZOLAM 1 MG PO TABS Oral Take 1 mg by mouth 3 (three) times daily.      Marland Kitchen DM-GUAIFENESIN ER 30-600 MG PO TB12 Oral Take 1 tablet by mouth daily as needed. For congestion     . FLUTICASONE-SALMETEROL 250-50 MCG/DOSE IN AEPB Inhalation Inhale 1 puff into the lungs  every 12 (twelve) hours.      . FUROSEMIDE 40 MG PO TABS Oral Take 40 mg by mouth daily.      Marland Kitchen GABAPENTIN 600 MG PO TABS Oral Take 600 mg by mouth 3 (three) times daily.      . IBUPROFEN 800 MG PO TABS Oral Take 800 mg by mouth every 8 (eight) hours as needed. For pain     . LISINOPRIL 20 MG PO TABS Oral Take 20 mg by mouth daily.      . OXYCODONE-ACETAMINOPHEN 5-325 MG PO TABS Oral Take 1 tablet by mouth every 4 (four) hours as needed for pain. 20 tablet 0  . POTASSIUM CHLORIDE CRYS ER 20 MEQ PO TBCR Oral Take 20 mEq by mouth daily.      Marland Kitchen PREDNISONE 20  MG PO TABS Oral Take 3 tablets (60 mg total) by mouth daily. 12 tablet 0  . TRAMADOL HCL 50 MG PO TABS Oral Take 50-100 mg by mouth every 6 (six) hours as needed. For painMaximum dose= 8 tablets per day      BP 149/78  Pulse 108  Temp(Src) 98.3 F (36.8 C) (Oral)  Resp 22  Ht 5\' 7"  (1.702 m)  Wt 300 lb (136.079 kg)  BMI 46.99 kg/m2  SpO2 100%  Physical Exam  Nursing note and vitals reviewed. Constitutional: She is oriented to person, place, and time. She appears well-developed and well-nourished.  Non-toxic appearance.  HENT:  Head: Normocephalic.  Right Ear: Tympanic membrane and external ear normal.  Left Ear: Tympanic membrane and external ear normal.       Patient has nasal congestion present time [place] of mucus draining in the back of her throat.  Eyes: EOM and lids are normal. Pupils are equal, round, and reactive to light.  Neck: Normal range of motion. Neck supple. Carotid bruit is not present.  Cardiovascular: Normal rate, regular rhythm, normal heart sounds, intact distal pulses and normal pulses.   Pulmonary/Chest: No respiratory distress. She has wheezes. She exhibits tenderness.       Bilateral chest wall soreness to palpation and deep breathing. Patient has a very deep congested cough. Bilateral wheezes and rhonchi present.  Abdominal: Soft. Bowel sounds are normal. There is no tenderness. There is no guarding.  Musculoskeletal: Normal range of motion.  Lymphadenopathy:       Head (right side): No submandibular adenopathy present.       Head (left side): No submandibular adenopathy present.    She has no cervical adenopathy.  Neurological: She is alert and oriented to person, place, and time. She has normal strength. No cranial nerve deficit or sensory deficit.  Skin: Skin is warm and dry.  Psychiatric: She has a normal mood and affect. Her speech is normal.    ED Course  Procedures (including critical care time) Pulse oximetry 100% on room air. Within normal  limits by my interpretation. Labs Reviewed - No data to display No results found.   No diagnosis found.    MDM  I have reviewed nursing notes, vital signs, and all appropriate lab and imaging results for this patient.   Previous records reviewed. Prescription for Decadron 4 mg twice a day, doxycycline 100 mg twice a day, and Percocet one every 4 hours as needed for pain given to the patient. Patient is to see the primary care physician or return to emergency Department if any changes or problems.    Kathie Dike, [place] 11/01/11 2144

## 2011-11-01 NOTE — ED Notes (Signed)
MD at bedside. 

## 2011-11-01 NOTE — ED Notes (Signed)
C/o cough/congestion x 2 weeks; states was seen and tx for same in this ED 1.5 weeks ago; reports no better.

## 2011-11-17 ENCOUNTER — Emergency Department (HOSPITAL_COMMUNITY): Payer: Medicaid Other

## 2011-11-17 ENCOUNTER — Emergency Department (HOSPITAL_COMMUNITY)
Admission: EM | Admit: 2011-11-17 | Discharge: 2011-11-17 | Disposition: A | Discharge status: Home / Self Care | Payer: Medicaid Other | Attending: Emergency Medicine | Admitting: Emergency Medicine

## 2011-11-17 ENCOUNTER — Encounter (HOSPITAL_COMMUNITY): Payer: Self-pay | Admitting: Emergency Medicine

## 2011-11-17 DIAGNOSIS — G8929 Other chronic pain: Secondary | ICD-10-CM | POA: Insufficient documentation

## 2011-11-17 DIAGNOSIS — M549 Dorsalgia, unspecified: Secondary | ICD-10-CM | POA: Insufficient documentation

## 2011-11-17 DIAGNOSIS — W19XXXA Unspecified fall, initial encounter: Secondary | ICD-10-CM

## 2011-11-17 DIAGNOSIS — F17200 Nicotine dependence, unspecified, uncomplicated: Secondary | ICD-10-CM | POA: Insufficient documentation

## 2011-11-17 DIAGNOSIS — J449 Chronic obstructive pulmonary disease, unspecified: Secondary | ICD-10-CM | POA: Insufficient documentation

## 2011-11-17 DIAGNOSIS — M25561 Pain in right knee: Secondary | ICD-10-CM

## 2011-11-17 DIAGNOSIS — I10 Essential (primary) hypertension: Secondary | ICD-10-CM | POA: Insufficient documentation

## 2011-11-17 DIAGNOSIS — M25569 Pain in unspecified knee: Secondary | ICD-10-CM | POA: Insufficient documentation

## 2011-11-17 DIAGNOSIS — Z79899 Other long term (current) drug therapy: Secondary | ICD-10-CM | POA: Insufficient documentation

## 2011-11-17 DIAGNOSIS — J4489 Other specified chronic obstructive pulmonary disease: Secondary | ICD-10-CM | POA: Insufficient documentation

## 2011-11-17 DIAGNOSIS — H4720 Unspecified optic atrophy: Secondary | ICD-10-CM | POA: Insufficient documentation

## 2011-11-17 MED ORDER — KETOROLAC TROMETHAMINE 60 MG/2ML IM SOLN
60.0000 mg | Freq: Once | INTRAMUSCULAR | Status: AC
  Administered 2011-11-17: 60 mg via INTRAMUSCULAR
  Filled 2011-11-17: qty 2

## 2011-11-17 MED ORDER — HYDROCODONE-ACETAMINOPHEN 5-325 MG PO TABS: 2.0000 | ORAL_TABLET | Freq: Four times a day (QID) | ORAL | Status: AC | PRN

## 2011-11-17 NOTE — ED Notes (Signed)
Pt c/o falling yesterday-c/o bilateral knee pain.

## 2011-11-17 NOTE — ED Provider Notes (Signed)
History   This chart was scribed for Ward Givens, MD by Clarita Crane. The patient was seen in room APA15/APA15 and the patient's care was started at 8:49AM.   CSN: [id number]  Arrival date & time 11/17/11  0830   First MD Initiated Contact with Patient 11/17/11 [phone]      Chief Complaint  Patient presents with  . Fall    (Consider location/radiation/quality/duration/timing/severity/associated sxs/prior Treatment)  HPI Nancy Lynn is a 36 y.o. female who presents to the Emergency Department complaining of constant mild to moderate bilateral knee pain onset yesterday after sustaining a fall in which she fell onto bilateral knees on concrete steps after slipping on black ice yesterday (temp 50's). States she has been up since 3 am b/o pain. Denies head injury and LOC with fall. Patient reports h/o chronic knee pain which is to be followed by Dr. Romeo Apple. Also notes h/o chronic back pain, COPD, HTN, anxiety. Patient is a current smoker and is an occasional drinker  PCP Dr Ashley Mariner [facility]  Past Medical History  Diagnosis Date  . COPD (chronic obstructive pulmonary disease)   . Hypertension   -Optic atrophy Anxiety Chronic back pain  Past Surgical History  Procedure Date  . Cesarean section   . Abdominal hysterectomy   . Tubal ligation     Family History  Problem Relation Age of Onset  . Diabetes Mother   . Hypertension Father     History  Substance Use Topics  . Smoking status: Current Everyday Smoker -- 1.0 packs/day    Types: Cigarettes  . Smokeless tobacco: Not on file  . Alcohol Use: Yes  -On disability (Legally blind) Lives with 5 children and significant others  OB History    Grav Para Term Preterm Abortions TAB SAB Ect Mult Living                  Review of Systems 10 Systems reviewed and are negative for acute change except as noted in the HPI.  Allergies  Latex  Home Medications   Current Outpatient Rx  Name Route Sig Dispense Refill  .  ALBUTEROL SULFATE (5 MG/ML) 0.5% IN NEBU Nebulization Take 1 mL (5 mg total) by nebulization every 6 (six) hours as needed for wheezing. 20 mL 0  . ALBUTEROL SULFATE HFA 108 (90 BASE) MCG/ACT IN AERS Inhalation Inhale 2 puffs into the lungs daily as needed. For shortness of breath     . ALPRAZOLAM 1 MG PO TABS Oral Take 1 mg by mouth 3 (three) times daily.      Marland Kitchen DM-GUAIFENESIN ER 30-600 MG PO TB12 Oral Take 1 tablet by mouth daily as needed. For congestion     . FLUTICASONE-SALMETEROL 250-50 MCG/DOSE IN AEPB Inhalation Inhale 1 puff into the lungs every 12 (twelve) hours.      . FUROSEMIDE 40 MG PO TABS Oral Take 40 mg by mouth daily.      Marland Kitchen GABAPENTIN 600 MG PO TABS Oral Take 600 mg by mouth 3 (three) times daily.      . IBUPROFEN 800 MG PO TABS Oral Take 800 mg by mouth every 8 (eight) hours as needed. For pain     . LISINOPRIL 20 MG PO TABS Oral Take 20 mg by mouth daily.      Marland Kitchen POTASSIUM CHLORIDE CRYS ER 20 MEQ PO TBCR Oral Take 20 mEq by mouth daily.      . TRAMADOL HCL 50 MG PO TABS Oral Take 50-100 mg  by mouth every 6 (six) hours as needed. For painMaximum dose= 8 tablets per day      BP 130/93  Pulse 107  Temp 98.6 F (37 C)  Resp 20  Ht 5\' 7"  (1.702 m)  Wt 305 lb (138.347 kg)  BMI 47.77 kg/m2  SpO2 100%  Vital signs normal except tachycardia   Physical Exam  Nursing note and vitals reviewed. Constitutional: She is oriented to person, place, and time. She appears well-developed and well-nourished. No distress.       Obese  HENT:  Head: Normocephalic and atraumatic.       Mucous membranes moist.   Eyes: EOM are normal.       Pupils are small and minimally reactive.   Neck: Neck supple. No tracheal deviation present.  Cardiovascular: Normal rate and regular rhythm.   No murmur heard. Pulmonary/Chest: Effort normal. No respiratory distress. She has no wheezes. She has no rales.  Abdominal: Soft. She exhibits no distension.  Musculoskeletal: Normal range of motion. She  exhibits no tenderness.       No joint effusions to bilateral knees. No bruising noted to bilateral lower extremities. No abrasions. She has no localizing pain to palpation.  Neurological: She is alert and oriented to person, place, and time. No sensory deficit.  Skin: Skin is warm and dry.  Psychiatric: She has a normal mood and affect. Her behavior is normal.    ED Course  Procedures (including critical care time)  [facility] shows patient has gotten oxycodone 5/325 on 1/15 and 1/9 # 20 tabs both from ED  DIAGNOSTIC STUDIES: Oxygen Saturation is 100% on room air, normal by my interpretation.    COORDINATION OF CARE: 8:58AM- Patient informed of current plan for treatment and evaluation and agrees with plan at this time.  9:01AM- Patient's past medical records reviewed.  11:09AM- Patient resting comfortably and sleeping and displaying no distress at this time. Patient informed of current imaging results. Patient states knee pain not improved with administration of Toradol-60mg  IM. Will d/c home  Labs Reviewed - No data to display Dg Knee Complete 4 Views Left  11/17/2011  *RADIOLOGY REPORT*  Clinical Data: Fall.  Knee pain.  LEFT KNEE - COMPLETE 4+ VIEW  Comparison: None.  Findings: Moderate tricompartment osteoarthritic change.  No acute fracture and no dislocation.  Moderate joint effusion.  IMPRESSION: No acute bony pathology.  Degenerative change.  Joint effusion.  Original Report Authenticated By: Donavan Burnet, M.D.   Dg Knee Complete 4 Views Right  11/17/2011  *RADIOLOGY REPORT*  Clinical Data: Status post fall.  Knee pain.  RIGHT KNEE - COMPLETE 4+ VIEW  Comparison: None.  Findings: There is no fracture.  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  Associated small joint effusion is noted.  IMPRESSION:  1.  No acute finding. 2.  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  Original Report Authenticated By: Bernadene Bell. Maricela Curet, M.D.    Diagnoses that have  been ruled out:  None  Diagnoses that are still under consideration:  None  Final diagnoses:  Fall  Knee pain, bilateral  Chronic pain    New Prescriptions   HYDROCODONE-ACETAMINOPHEN (NORCO) 5-325 MG PER TABLET    Take 2 tablets by mouth every 6 (six) hours as needed for pain.   Plan discharge   Devoria Albe, MD, FACEP     MDM     I personally performed the services described in this documentation, which was scribed in my presence. The recorded information  has been reviewed and considered. Devoria Albe, MD, Armando Gang    Ward Givens, MD 11/17/11 [phone]

## 2011-11-24 ENCOUNTER — Encounter (HOSPITAL_COMMUNITY): Payer: Self-pay | Admitting: *Deleted

## 2011-11-24 DIAGNOSIS — J111 Influenza due to unidentified influenza virus with other respiratory manifestations: Secondary | ICD-10-CM | POA: Insufficient documentation

## 2011-11-24 NOTE — ED Notes (Signed)
Pt reports flu-like s&s for the past 3 days

## 2011-11-25 ENCOUNTER — Emergency Department (HOSPITAL_COMMUNITY)
Admission: EM | Admit: 2011-11-25 | Discharge: 2011-11-25 | Discharge status: Against Medical Advice | Payer: Medicaid Other | Attending: Emergency Medicine | Admitting: Emergency Medicine

## 2012-01-05 ENCOUNTER — Emergency Department (HOSPITAL_COMMUNITY): Payer: Medicaid Other

## 2012-01-05 ENCOUNTER — Emergency Department (HOSPITAL_COMMUNITY)
Admission: EM | Admit: 2012-01-05 | Discharge: 2012-01-05 | Disposition: A | Discharge status: Home / Self Care | Payer: Medicaid Other | Attending: Emergency Medicine | Admitting: Emergency Medicine

## 2012-01-05 ENCOUNTER — Encounter (HOSPITAL_COMMUNITY): Payer: Self-pay

## 2012-01-05 DIAGNOSIS — J4489 Other specified chronic obstructive pulmonary disease: Secondary | ICD-10-CM | POA: Insufficient documentation

## 2012-01-05 DIAGNOSIS — K8020 Calculus of gallbladder without cholecystitis without obstruction: Secondary | ICD-10-CM | POA: Insufficient documentation

## 2012-01-05 DIAGNOSIS — J449 Chronic obstructive pulmonary disease, unspecified: Secondary | ICD-10-CM | POA: Insufficient documentation

## 2012-01-05 DIAGNOSIS — I10 Essential (primary) hypertension: Secondary | ICD-10-CM | POA: Insufficient documentation

## 2012-01-05 DIAGNOSIS — Z9079 Acquired absence of other genital organ(s): Secondary | ICD-10-CM | POA: Insufficient documentation

## 2012-01-05 DIAGNOSIS — F17200 Nicotine dependence, unspecified, uncomplicated: Secondary | ICD-10-CM | POA: Insufficient documentation

## 2012-01-05 LAB — COMPREHENSIVE METABOLIC PANEL
Albumin: 3.6 g/dL (ref 3.5–5.2)
Alkaline Phosphatase: 54 U/L (ref 39–117)
BUN: 9 mg/dL (ref 6–23)
Calcium: 9.7 mg/dL (ref 8.4–10.5)
Potassium: 4.6 mEq/L (ref 3.5–5.1)
Sodium: 138 mEq/L (ref 135–145)
Total Protein: 6.7 g/dL (ref 6.0–8.3)

## 2012-01-05 LAB — CBC
MCH: 31 pg (ref 26.0–34.0)
MCHC: 32.8 g/dL (ref 30.0–36.0)
Platelets: 241 10*3/uL (ref 150–400)

## 2012-01-05 LAB — LIPASE, BLOOD: Lipase: 20 U/L (ref 11–59)

## 2012-01-05 LAB — DIFFERENTIAL
Basophils Relative: 1 % (ref 0–1)
Eosinophils Absolute: 0.1 10*3/uL (ref 0.0–0.7)
Eosinophils Relative: 2 % (ref 0–5)
Monocytes Relative: 9 % (ref 3–12)
Neutrophils Relative %: 57 % (ref 43–77)

## 2012-01-05 LAB — URINALYSIS, ROUTINE W REFLEX MICROSCOPIC
Bilirubin Urine: NEGATIVE
Ketones, ur: NEGATIVE mg/dL
Nitrite: NEGATIVE
Urobilinogen, UA: 0.2 mg/dL (ref 0.0–1.0)

## 2012-01-05 MED ORDER — HYDROMORPHONE HCL PF 2 MG/ML IJ SOLN
INTRAMUSCULAR | Status: AC
  Administered 2012-01-05: 1 mg
  Filled 2012-01-05: qty 1

## 2012-01-05 MED ORDER — HYDROMORPHONE HCL PF 1 MG/ML IJ SOLN
1.0000 mg | Freq: Once | INTRAMUSCULAR | Status: AC
  Administered 2012-01-05: 1 mg via INTRAVENOUS
  Filled 2012-01-05: qty 17

## 2012-01-05 MED ORDER — HYDROMORPHONE HCL PF 1 MG/ML IJ SOLN: 1.0000 mg | Freq: Once | INTRAMUSCULAR | Status: DC

## 2012-01-05 MED ORDER — ONDANSETRON HCL 4 MG/2ML IJ SOLN
4.0000 mg | Freq: Once | INTRAMUSCULAR | Status: AC
  Administered 2012-01-05: 4 mg via INTRAVENOUS
  Filled 2012-01-05: qty 2

## 2012-01-05 MED ORDER — PROMETHAZINE HCL 25 MG PO TABS: 12.5000 mg | ORAL_TABLET | Freq: Four times a day (QID) | ORAL | Status: AC | PRN

## 2012-01-05 MED ORDER — PANTOPRAZOLE SODIUM 40 MG IV SOLR
40.0000 mg | Freq: Once | INTRAVENOUS | Status: AC
  Administered 2012-01-05: 40 mg via INTRAVENOUS
  Filled 2012-01-05: qty 40

## 2012-01-05 MED ORDER — OXYCODONE-ACETAMINOPHEN 5-325 MG PO TABS: 2.0000 | ORAL_TABLET | ORAL | Status: AC | PRN

## 2012-01-05 NOTE — ED Notes (Signed)
Pt reports for the past couple of months has had pain in r side.  Says was told by her pcp she needed her gallbladder out.  Pt says hasn't been able to yet.  C/O nausea, no vomiting.  Reports generalized weakness.

## 2012-01-05 NOTE — ED Notes (Signed)
Pt stated she has been sick since at least dec. 2012, w/ ab pain and nausea, has been told she needs her gallbladder removed, but unable d/t family/money issues.  In er today for severe ab pain, that has become increasingly worse over the last few days.  +nausea, but denies any vomiting or diarrhea. No fever.  Urine specimen obtained and sent to lab. Pt now awaiting md

## 2012-01-05 NOTE — ED Provider Notes (Signed)
History   This chart was scribed for [employer]. Colon Branch, MD by Davonna Belling. The patient was seen in room APA05/APA05. Patient's care was started at 0850.   CSN: [id number]  Arrival date & time 01/05/12  4098   First MD Initiated Contact with Patient 01/05/12 [phone]      Chief Complaint  Patient presents with  . Flank Pain    (Consider location/radiation/quality/duration/timing/severity/associated sxs/prior treatment) HPI Nancy Lynn is a 36 y.o. female who presents to the Emergency Department complaining of intermittent moderate right costal margin pain onset a few month ago. Pain is described as throbbing with associated symptoms of nausea diarrhea, generalized weakness, and fever. Denies vomiting. Patient with h/o COPD and HTN.  Mother has history of cholecystectomy.    Past Medical History  Diagnosis Date  . COPD (chronic obstructive pulmonary disease)   . Hypertension     Past Surgical History  Procedure Date  . Cesarean section   . Abdominal hysterectomy   . Tubal ligation     Family History  Problem Relation Age of Onset  . Diabetes Mother   . Hypertension Father     History  Substance Use Topics  . Smoking status: Current Everyday Smoker -- 1.0 packs/day    Types: Cigarettes  . Smokeless tobacco: Not on file  . Alcohol Use: Yes    OB History    Grav Para Term Preterm Abortions TAB SAB Ect Mult Living                  Review of Systems 10 Systems reviewed and are negative for acute change except as noted in the HPI.  Allergies  Latex  Home Medications   Current Outpatient Rx  Name Route Sig Dispense Refill  . ALBUTEROL SULFATE HFA 108 (90 BASE) MCG/ACT IN AERS Inhalation Inhale 2 puffs into the lungs daily as needed. For shortness of breath     . ALPRAZOLAM 1 MG PO TABS Oral Take 1 mg by mouth 3 (three) times daily.      Marland Kitchen FLUTICASONE-SALMETEROL 250-50 MCG/DOSE IN AEPB Inhalation Inhale 1 puff into the lungs every 12 (twelve) hours.      .  FUROSEMIDE 40 MG PO TABS Oral Take 40 mg by mouth daily.      Marland Kitchen GABAPENTIN 600 MG PO TABS Oral Take 600 mg by mouth 3 (three) times daily.     . IBUPROFEN 800 MG PO TABS Oral Take 800 mg by mouth every 8 (eight) hours as needed. For pain     . LISINOPRIL 20 MG PO TABS Oral Take 20 mg by mouth daily.      Marland Kitchen TIZANIDINE HCL 2 MG PO TABS Oral Take 2 mg by mouth 3 (three) times daily.      BP 120/61  Pulse 81  Temp(Src) 97.6 F (36.4 C) (Oral)  Resp 22  Ht 5\' 6"  (1.676 m)  Wt 300 lb (136.079 kg)  BMI 48.42 kg/m2  SpO2 100%  Physical Exam  Nursing note and vitals reviewed. Constitutional: She is oriented to person, place, and time. She appears well-developed and well-nourished. No distress.  HENT:  Head: Normocephalic and atraumatic.       Edentulous   Eyes: EOM are normal. Pupils are equal, round, and reactive to light.  Neck: Neck supple. No tracheal deviation present.  Cardiovascular: Normal rate, regular rhythm and normal heart sounds.  Exam reveals no gallop and no friction rub.   No murmur heard. Pulmonary/Chest: Effort normal. No respiratory  distress. She has no wheezes. She has no rales.  Abdominal: Soft. She exhibits no distension.       Tenderness in the right costal margin   Musculoskeletal: Normal range of motion. She exhibits no edema.  Neurological: She is alert and oriented to person, place, and time. No sensory deficit.  Skin: Skin is warm and dry.  Psychiatric: She has a normal mood and affect. Her behavior is normal.    ED Course  Procedures (including critical care time)  DIAGNOSTIC STUDIES: Oxygen Saturation is 100% on room air , normal by my interpretation.    COORDINATION OF CARE:  10:27AM- Patient informed of current plan for treatment and evaluation and agrees with plan at this time.  2:38 PM- Patient informed of gallstones and recommended surgery. Sent home with information of surgeon and surgery. Medicine for nausea given.   Results for orders placed  during the hospital encounter of 01/05/12  URINALYSIS, ROUTINE W REFLEX MICROSCOPIC      Component Value Range   Color, Urine YELLOW  YELLOW    APPearance CLEAR  CLEAR    Specific Gravity, Urine 1.005  1.005 - 1.030    pH 6.0  5.0 - 8.0    Glucose, UA NEGATIVE  NEGATIVE (mg/dL)   Hgb urine dipstick NEGATIVE  NEGATIVE    Bilirubin Urine NEGATIVE  NEGATIVE    Ketones, ur NEGATIVE  NEGATIVE (mg/dL)   Protein, ur NEGATIVE  NEGATIVE (mg/dL)   Urobilinogen, UA 0.2  0.0 - 1.0 (mg/dL)   Nitrite NEGATIVE  NEGATIVE    Leukocytes, UA NEGATIVE  NEGATIVE   CBC      Component Value Range   WBC 8.1  4.0 - 10.5 (K/uL)   RBC 4.23  3.87 - 5.11 (MIL/uL)   Hemoglobin 13.1  12.0 - 15.0 (g/dL)   HCT 56.2  13.0 - 86.5 (%)   MCV 94.6  78.0 - 100.0 (fL)   MCH 31.0  26.0 - 34.0 (pg)   MCHC 32.8  30.0 - 36.0 (g/dL)   RDW 78.4  69.6 - 29.5 (%)   Platelets 241  150 - 400 (K/uL)  DIFFERENTIAL      Component Value Range   Neutrophils Relative 57  43 - 77 (%)   Neutro Abs 4.6  1.7 - 7.7 (K/uL)   Lymphocytes Relative 33  12 - 46 (%)   Lymphs Abs 2.7  0.7 - 4.0 (K/uL)   Monocytes Relative 9  3 - 12 (%)   Monocytes Absolute 0.7  0.1 - 1.0 (K/uL)   Eosinophils Relative 2  0 - 5 (%)   Eosinophils Absolute 0.1  0.0 - 0.7 (K/uL)   Basophils Relative 1  0 - 1 (%)   Basophils Absolute 0.0  0.0 - 0.1 (K/uL)  COMPREHENSIVE METABOLIC PANEL      Component Value Range   Sodium 138  135 - 145 (mEq/L)   Potassium 4.6  3.5 - 5.1 (mEq/L)   Chloride 104  96 - 112 (mEq/L)   CO2 27  19 - 32 (mEq/L)   Glucose, Bld 105 (*) 70 - 99 (mg/dL)   BUN 9  6 - 23 (mg/dL)   Creatinine, Ser 2.84  0.50 - 1.10 (mg/dL)   Calcium 9.7  8.4 - 13.2 (mg/dL)   Total Protein 6.7  6.0 - 8.3 (g/dL)   Albumin 3.6  3.5 - 5.2 (g/dL)   AST 21  0 - 37 (U/L)   ALT 14  0 - 35 (U/L)  Alkaline Phosphatase 54  39 - 117 (U/L)   Total Bilirubin 0.2 (*) 0.3 - 1.2 (mg/dL)   GFR calc non Af Amer 75 (*) >90 (mL/min)   GFR calc Af Amer 87 (*) >90  (mL/min)  LIPASE, BLOOD      Component Value Range   Lipase 20  11 - 59 (U/L)    US Abdomen Limited  01/05/2012  *RADIOLOGY REPORT*  Clinical Data:  Right upper quadrant pain  LIMITED ABDOMINAL ULTRASOUND - RIGHT UPPER QUADRANT  Comparison:  CT 11/04/2009  Findings:  Gallbladder:  There are multiple gallstones layering dependently in the gallbladder including a stone in the gallbladder neck.  No wall thickening or pericholecystic fluid.  No Murphy's sign.  Common bile duct:  Normal diameter proximally but to somewhat dilated to more distally in the region of the pancreatic head.  No definable common duct stone.  Liver:  Normal without focal lesions or biliary ductal dilatation.  IMPRESSION: Multiple gallstones dependent within the gallbladder including one or more stones in the gallbladder neck.  No sonographic evidence of acute cholecystitis however.                   Original Report Authenticated By: Thomasenia Sales, M.D.     No diagnosis found.    MDM  Patient with right upper quadrant abdominal pain. Given IV fluids, analgesics, antiemetics, with improvement. Ultrasound shows multiple gallstones including stones in the gallbladder neck. She is given referral to a surgeon. Pt feels improved after observation and/or treatment in ED.Pt stable in ED with no significant deterioration in condition.The patient appears reasonably screened and/or stabilized for discharge and I doubt any other medical condition or other [facility] requiring further screening, evaluation, or treatment in the ED at this time prior to discharge.  .I personally performed the services described in this documentation, which was scribed in my presence. The recorded information has been reviewed and considered.   MDM Reviewed: nursing note and vitals Interpretation: labs and ultrasound           Nicoletta Dress. Colon Branch, MD 01/06/12 [phone]

## 2012-01-05 NOTE — Discharge Instructions (Signed)
Your US shows gall stones which are the most likely cause of your pain. Contact the surgeon listed on the paperwork to set up a time to have surgery.Use the pain and nausea medicine as needed.   Gallstones Gallstones are a form of gallbladder disease. The gallbladder is a small organ that helps you digest food.  HOME CARE  Only take medicine as told by your doctor.   Eat a low-fat diet.   Follow up as told.  GET HELP RIGHT AWAY IF:   Your pain gets worse.   You develop yellow skin or eyes (jaundice).   The pain moves to another part of your belly (abdomen) or back.   You have a temperature by mouth above 102 F (38.9 C), not controlled by medicine.   You feel sick to your stomach (nauseous) and throw up (vomit).  MAKE SURE YOU:   Understand these instructions.   Will watch your condition.   Will get help right away if you are not doing well or get worse.  Document Released: 03/22/2008 Document Revised: 09/23/2011 Document Reviewed: 09/09/2009 [facility] Patient Information 2012 [place], [place].

## 2012-01-05 NOTE — ED Notes (Signed)
edp in with pt 

## 2012-01-07 ENCOUNTER — Encounter (HOSPITAL_COMMUNITY): Payer: Self-pay | Admitting: *Deleted

## 2012-01-07 ENCOUNTER — Emergency Department (HOSPITAL_COMMUNITY)
Admission: EM | Admit: 2012-01-07 | Discharge: 2012-01-07 | Disposition: A | Discharge status: Home / Self Care | Payer: Medicaid Other | Attending: Emergency Medicine | Admitting: Emergency Medicine

## 2012-01-07 DIAGNOSIS — Z760 Encounter for issue of repeat prescription: Secondary | ICD-10-CM

## 2012-01-07 DIAGNOSIS — K8020 Calculus of gallbladder without cholecystitis without obstruction: Secondary | ICD-10-CM | POA: Insufficient documentation

## 2012-01-07 HISTORY — DX: Calculus of gallbladder without cholecystitis without obstruction: K80.20

## 2012-01-07 MED ORDER — HYDROCODONE-ACETAMINOPHEN 5-325 MG PO TABS: ORAL_TABLET | ORAL | Status: DC

## 2012-01-07 MED ORDER — ONDANSETRON 8 MG PO TBDP
8.0000 mg | ORAL_TABLET | Freq: Once | ORAL | Status: AC
  Administered 2012-01-07: 8 mg via ORAL
  Filled 2012-01-07: qty 1

## 2012-01-07 MED ORDER — HYDROCODONE-ACETAMINOPHEN 10-325 MG PO TABS: 1.0000 | ORAL_TABLET | Freq: Four times a day (QID) | ORAL | Status: AC | PRN

## 2012-01-07 MED ORDER — OXYCODONE-ACETAMINOPHEN 5-325 MG PO TABS
1.0000 | ORAL_TABLET | Freq: Once | ORAL | Status: AC
  Administered 2012-01-07: 1 via ORAL
  Filled 2012-01-07: qty 1

## 2012-01-07 NOTE — Discharge Instructions (Signed)
Medication Refill, Emergency Department  We have refilled your medication today as a courtesy to you. It is best for your medical care, however, to take care of getting refills done through your primary caregiver's office. They have your records and can do a better job of follow-up than we can in the emergency department.  On maintenance medications, we often only prescribe enough medications to get you by until you are able to see your regular caregiver. This is a more expensive way to refill medications.  In the future, please plan for refills so that you will not have to use the emergency department for this.  Thank you for your help. Your help allows us to better take care of the daily emergencies that enter our department.  Document Released: 01/21/2004 Document Revised: 09/23/2011 Document Reviewed: 10/04/2005  [employer] Patient Information 2012 [employer].

## 2012-01-07 NOTE — ED Notes (Signed)
Abd pain, nausea, Dx with gall stones. Has appt  With Dr Caesar Bookman, but is out of pain meds.

## 2012-01-11 NOTE — ED Provider Notes (Signed)
History     CSN: [id number]  Arrival date & time 01/07/12  1354   First MD Initiated Contact with Patient 01/07/12 1510      Chief Complaint  Patient presents with  . Medication Refill    (Consider location/radiation/quality/duration/timing/severity/associated sxs/prior treatment) Patient is a 36 y.o. female presenting with abdominal pain. The history is provided by the patient. No language interpreter was used.  Abdominal Pain The primary symptoms of the illness include abdominal pain and nausea. The primary symptoms of the illness do not include fever, shortness of breath, vomiting, diarrhea, hematemesis, hematochezia or dysuria. The current episode started more than 2 days ago. The onset of the illness was gradual. The problem has not changed since onset. The abdominal pain began more than 2 days ago. The pain came on gradually. The abdominal pain has been unchanged since its onset. The abdominal pain is located in the right flank. The abdominal pain does not radiate. The abdominal pain is relieved by nothing. The abdominal pain is exacerbated by certain positions.  The patient states that she believes she is currently not pregnant. The patient has not had a change in bowel habit. Symptoms associated with the illness do not include chills, diaphoresis, heartburn, urgency, hematuria, frequency or back pain.    Past Medical History  Diagnosis Date  . COPD (chronic obstructive pulmonary disease)   . Hypertension   . Gall stones     Past Surgical History  Procedure Date  . Cesarean section   . Abdominal hysterectomy   . Tubal ligation     Family History  Problem Relation Age of Onset  . Diabetes Mother   . Hypertension Father     History  Substance Use Topics  . Smoking status: Current Everyday Smoker -- 1.0 packs/day    Types: Cigarettes  . Smokeless tobacco: Not on file  . Alcohol Use: Yes    OB History    Grav Para Term Preterm Abortions TAB SAB Ect Mult Living                  Review of Systems  Constitutional: Positive for appetite change. Negative for fever, chills and diaphoresis.  Respiratory: Negative for shortness of breath.   Gastrointestinal: Positive for nausea and abdominal pain. Negative for heartburn, vomiting, diarrhea, hematochezia and hematemesis.  Genitourinary: Negative for dysuria, urgency, frequency and hematuria.  Musculoskeletal: Negative for back pain.  All other systems reviewed and are negative.    Allergies  Latex  Home Medications   Current Outpatient Rx  Name Route Sig Dispense Refill  . ALBUTEROL SULFATE HFA 108 (90 BASE) MCG/ACT IN AERS Inhalation Inhale 2 puffs into the lungs daily as needed. For shortness of breath     . ALPRAZOLAM 1 MG PO TABS Oral Take 1 mg by mouth 3 (three) times daily.      Marland Kitchen FLUTICASONE-SALMETEROL 250-50 MCG/DOSE IN AEPB Inhalation Inhale 1 puff into the lungs every 12 (twelve) hours.      . FUROSEMIDE 40 MG PO TABS Oral Take 40 mg by mouth daily.      Marland Kitchen GABAPENTIN 600 MG PO TABS Oral Take 600 mg by mouth 3 (three) times daily.     . IBUPROFEN 800 MG PO TABS Oral Take 800 mg by mouth every 8 (eight) hours as needed. For pain     . LISINOPRIL 20 MG PO TABS Oral Take 20 mg by mouth daily.      . OXYCODONE-ACETAMINOPHEN 5-325 MG PO TABS Oral Take  2 tablets by mouth every 4 (four) hours as needed for pain. 15 tablet 0  . PROMETHAZINE HCL 25 MG PO TABS Oral Take 0.5 tablets (12.5 mg total) by mouth every 6 (six) hours as needed for nausea. 10 tablet 0  . TIZANIDINE HCL 2 MG PO TABS Oral Take 2 mg by mouth 3 (three) times daily.    Marland Kitchen HYDROCODONE-ACETAMINOPHEN 10-325 MG PO TABS Oral Take 1 tablet by mouth every 6 (six) hours as needed for pain. 12 tablet 0    BP 137/51  Pulse 106  Temp(Src) 98 F (36.7 C) (Oral)  Resp 20  Ht 5\' 7"  (1.702 m)  Wt 300 lb (136.079 kg)  BMI 46.99 kg/m2  SpO2 97%  Physical Exam  Nursing note and vitals reviewed. Constitutional: She is oriented to person,  place, and time. She appears well-developed and well-nourished. No distress.  HENT:  Head: Normocephalic and atraumatic.  Mouth/Throat: Oropharynx is clear and moist.  Cardiovascular: Normal rate, regular rhythm, normal heart sounds and intact distal pulses.   No murmur heard. Pulmonary/Chest: Effort normal and breath sounds normal. No respiratory distress. She exhibits no tenderness.  Abdominal: Soft. She exhibits no distension. There is no hepatosplenomegaly. There is no tenderness. There is no rigidity, no rebound and no guarding.         Mild ttp of the right flank and lateral chest wall.  No guarding or rebound tenderness.  No distention.   Neurological: She is alert and oriented to person, place, and time. She exhibits normal muscle tone. Coordination normal.  Skin: Skin is warm and dry.    ED Course  Procedures (including critical care time)  Labs Reviewed - No data to display No results found.   1. Medication refill       MDM    Previous ED chart and nursing notes reviewed by me.    Patient with gallstones on recent US of abd.  Non-toxic appearing on this visit.  No clinical signs of dehydration.  Abd is soft w/o guarding or rebound tenderness.  States she has f/u appt with Dr. Caesar Bookman in a few days.  Requesting pain control.  No active vomiting.  She agrees to keep her previous appt with the surgeon.    Patient / Family / Caregiver understand and agree with initial ED impression and plan with expectations set for ED visit. Pt stable in ED with no significant deterioration in condition. Pt feels improved after observation and/or treatment in ED.       Milus Fritze L. Crowder, [place] 01/11/12 1643

## 2012-01-12 NOTE — ED Provider Notes (Signed)
Medical screening examination/treatment/procedure(s) were performed by non-physician practitioner and as supervising physician I was immediately available for consultation/collaboration.  Donnetta Hutching, MD 01/12/12 [phone]

## 2012-03-16 NOTE — Patient Instructions (Addendum)
PATIENT INSTRUCTIONS POST-ANESTHESIA  IMMEDIATELY FOLLOWING SURGERY:  Do not drive or operate machinery for the first twenty four hours after surgery.  Do not make any important decisions for twenty four hours after surgery or while taking narcotic pain medications or sedatives.  If you develop intractable nausea and vomiting or a severe headache please notify your doctor immediately.  FOLLOW-UP:  Please make an appointment with your surgeon as instructed. You do not need to follow up with anesthesia unless specifically instructed to do so.  WOUND CARE INSTRUCTIONS (if applicable):  Keep a dry clean dressing on the anesthesia/puncture wound site if there is drainage.  Once the wound has quit draining you may leave it open to air.  Generally you should leave the bandage intact for twenty four hours unless there is drainage.  If the epidural site drains for more than 36-48 hours please call the anesthesia department.  QUESTIONS?:  Please feel free to call your physician or the hospital operator if you have any questions, and they will be happy to assist you.     Nancy Lynn  03/16/2012   Your procedure is scheduled on:   03/24/2012  Report to [facility] at  615  AM.  Call this number if you have problems the morning of surgery: [phone]   Remember:   Do not eat food:After Midnight.  May have clear liquids:until Midnight .    Take these medicines the morning of surgery with A SIP OF WATER: xanax,naurontin,prinivil. Take your advair and your ventolin before you come.   Do not wear jewelry, make-up or nail polish.  Do not wear lotions, powders, or perfumes. You may wear deodorant.  Do not shave 48 hours prior to surgery. Men may shave face and neck.  Do not bring valuables to the hospital.  Contacts, dentures or bridgework may not be worn into surgery.  Leave suitcase in the car. After surgery it may be brought to your room.  For patients admitted to the hospital, checkout time is 11:00  AM the day of discharge.   Patients discharged the day of surgery will not be allowed to drive home.  Name and phone number of your driver: family  Special Instructions: CHG Shower Use Special Wash: 1/2 bottle night before surgery and 1/2 bottle morning of surgery.   Please read over the following fact sheets that you were given: Pain Booklet, MRSA Information, Surgical Site Infection Prevention, Anesthesia Post-op Instructions and Care and Recovery After Surgery Laparoscopic Cholecystectomy Laparoscopic cholecystectomy is surgery to remove the gallbladder. The gallbladder is located slightly to the right of center in the abdomen, behind the liver. It is a concentrating and storage sac for the bile produced in the liver. Bile aids in the digestion and absorption of fats. Gallbladder disease (cholecystitis) is an inflammation of your gallbladder. This condition is usually caused by a buildup of gallstones (cholelithiasis) in your gallbladder. Gallstones can block the flow of bile, resulting in inflammation and pain. In severe cases, emergency surgery may be required. When emergency surgery is not required, you will have time to prepare for the procedure. Laparoscopic surgery is an alternative to open surgery. Laparoscopic surgery usually has a shorter recovery time. Your common bile duct may also need to be examined and explored. Your caregiver will discuss this with you if he or she feels this should be done. If stones are found in the common bile duct, they may be removed. LET YOUR CAREGIVER KNOW ABOUT:  Allergies to food  or medicine.   Medicines taken, including vitamins, herbs, eyedrops, over-the-counter medicines, and creams.   Use of steroids (by mouth or creams).   Previous problems with anesthetics or numbing medicines.   History of bleeding problems or blood clots.   Previous surgery.   Other health problems, including diabetes and kidney problems.   Possibility of pregnancy, if this  applies.  RISKS AND COMPLICATIONS All surgery is associated with risks. Some problems that may occur following this procedure include:  Infection.   Damage to the common bile duct, nerves, arteries, veins, or other internal organs such as the stomach or intestines.   Bleeding.   A stone may remain in the common bile duct.  BEFORE THE PROCEDURE  Do not take aspirin for 3 days prior to surgery or blood thinners for 1 week prior to surgery.   Do not eat or drink anything after midnight the night before surgery.   Let your caregiver know if you develop a cold or other infectious problem prior to surgery.   You should be present 60 minutes before the procedure or as directed.  PROCEDURE  You will be given medicine that makes you sleep (general anesthetic). When you are asleep, your surgeon will make several small cuts (incisions) in your abdomen. One of these incisions is used to insert a small, lighted scope (laparoscope) into the abdomen. The laparoscope helps the surgeon see into your abdomen. Carbon dioxide gas will be pumped into your abdomen. The gas allows more room for the surgeon to perform your surgery. Other operating instruments are inserted through the other incisions. Laparoscopic procedures may not be appropriate when:  There is major scarring from previous surgery.   The gallbladder is extremely inflamed.   There are bleeding disorders or unexpected cirrhosis of the liver.   A pregnancy is near term.   Other conditions make the laparoscopic procedure impossible.  If your surgeon feels it is not safe to continue with a laparoscopic procedure, he or she will perform an open abdominal procedure. In this case, the surgeon will make an incision to open the abdomen. This gives the surgeon a larger view and field to work within. This may allow the surgeon to perform procedures that sometimes cannot be performed with a laparoscope alone. Open surgery has a longer recovery  time. AFTER THE PROCEDURE  You will be taken to the recovery area where a nurse will watch and check your progress.   You may be allowed to go home the same day.   Do not resume physical activities until directed by your caregiver.   You may resume a normal diet and activities as directed.  Document Released: 10/04/2005 Document Revised: 09/23/2011 Document Reviewed: 03/19/2011 [facility] Patient Information 2012 [place], [place].

## 2012-03-17 ENCOUNTER — Encounter (HOSPITAL_COMMUNITY)
Admission: RE | Admit: 2012-03-17 | Discharge: 2012-03-17 | Payer: Medicaid Other | Source: Ambulatory Visit | Attending: General Surgery | Admitting: General Surgery

## 2012-03-24 ENCOUNTER — Encounter (HOSPITAL_COMMUNITY): Admission: RE | Payer: Self-pay | Source: Ambulatory Visit

## 2012-03-24 ENCOUNTER — Ambulatory Visit (HOSPITAL_COMMUNITY): Admission: RE | Admit: 2012-03-24 | Payer: Medicaid Other | Source: Ambulatory Visit | Admitting: General Surgery

## 2012-03-24 SURGERY — LAPAROSCOPIC CHOLECYSTECTOMY
Anesthesia: General

## 2012-09-17 DIAGNOSIS — N179 Acute kidney failure, unspecified: Secondary | ICD-10-CM

## 2012-09-17 HISTORY — DX: Acute kidney failure, unspecified: N17.9

## 2012-09-23 ENCOUNTER — Other Ambulatory Visit: Payer: Self-pay

## 2012-09-23 ENCOUNTER — Inpatient Hospital Stay (HOSPITAL_COMMUNITY)
Admission: EM | Admit: 2012-09-23 | Discharge: 2012-09-26 | DRG: 683 | Disposition: A | Discharge status: Home / Self Care | Payer: Medicaid Other | Attending: Internal Medicine | Admitting: Internal Medicine

## 2012-09-23 ENCOUNTER — Encounter (HOSPITAL_COMMUNITY): Payer: Self-pay | Admitting: *Deleted

## 2012-09-23 ENCOUNTER — Inpatient Hospital Stay (HOSPITAL_COMMUNITY): Payer: Medicaid Other

## 2012-09-23 ENCOUNTER — Emergency Department (HOSPITAL_COMMUNITY): Payer: Medicaid Other

## 2012-09-23 DIAGNOSIS — I10 Essential (primary) hypertension: Secondary | ICD-10-CM | POA: Diagnosis present

## 2012-09-23 DIAGNOSIS — F17200 Nicotine dependence, unspecified, uncomplicated: Secondary | ICD-10-CM

## 2012-09-23 DIAGNOSIS — Z6841 Body Mass Index (BMI) 40.0 and over, adult: Secondary | ICD-10-CM

## 2012-09-23 DIAGNOSIS — R0789 Other chest pain: Secondary | ICD-10-CM | POA: Diagnosis present

## 2012-09-23 DIAGNOSIS — E669 Obesity, unspecified: Secondary | ICD-10-CM | POA: Diagnosis present

## 2012-09-23 DIAGNOSIS — F411 Generalized anxiety disorder: Secondary | ICD-10-CM | POA: Diagnosis present

## 2012-09-23 DIAGNOSIS — Z90710 Acquired absence of both cervix and uterus: Secondary | ICD-10-CM

## 2012-09-23 DIAGNOSIS — Z833 Family history of diabetes mellitus: Secondary | ICD-10-CM

## 2012-09-23 DIAGNOSIS — R079 Chest pain, unspecified: Secondary | ICD-10-CM

## 2012-09-23 DIAGNOSIS — K5900 Constipation, unspecified: Secondary | ICD-10-CM | POA: Diagnosis present

## 2012-09-23 DIAGNOSIS — F1910 Other psychoactive substance abuse, uncomplicated: Secondary | ICD-10-CM | POA: Diagnosis present

## 2012-09-23 DIAGNOSIS — J449 Chronic obstructive pulmonary disease, unspecified: Secondary | ICD-10-CM

## 2012-09-23 DIAGNOSIS — Z8249 Family history of ischemic heart disease and other diseases of the circulatory system: Secondary | ICD-10-CM

## 2012-09-23 DIAGNOSIS — I959 Hypotension, unspecified: Secondary | ICD-10-CM | POA: Diagnosis present

## 2012-09-23 DIAGNOSIS — J4489 Other specified chronic obstructive pulmonary disease: Secondary | ICD-10-CM | POA: Diagnosis present

## 2012-09-23 DIAGNOSIS — M25569 Pain in unspecified knee: Secondary | ICD-10-CM

## 2012-09-23 DIAGNOSIS — N179 Acute kidney failure, unspecified: Principal | ICD-10-CM | POA: Diagnosis present

## 2012-09-23 DIAGNOSIS — Z720 Tobacco use: Secondary | ICD-10-CM | POA: Diagnosis present

## 2012-09-23 LAB — BASIC METABOLIC PANEL
BUN: 61 mg/dL — ABNORMAL HIGH (ref 6–23)
CO2: 21 mEq/L (ref 19–32)
CO2: 22 mEq/L (ref 19–32)
Chloride: 95 mEq/L — ABNORMAL LOW (ref 96–112)
Chloride: 94 mEq/L — ABNORMAL LOW (ref 96–112)
Glucose, Bld: 118 mg/dL — ABNORMAL HIGH (ref 70–99)
Potassium: 3.7 mEq/L (ref 3.5–5.1)
Sodium: 134 mEq/L — ABNORMAL LOW (ref 135–145)
Sodium: 131 mEq/L — ABNORMAL LOW (ref 135–145)

## 2012-09-23 LAB — CBC WITH DIFFERENTIAL/PLATELET
Basophils Relative: 0 % (ref 0–1)
Eosinophils Relative: 1 % (ref 0–5)
HCT: 37.3 % (ref 36.0–46.0)
Hemoglobin: 12.6 g/dL (ref 12.0–15.0)
Lymphocytes Relative: 28 % (ref 12–46)
MCHC: 33.8 g/dL (ref 30.0–36.0)
Monocytes Relative: 7 % (ref 3–12)
Neutro Abs: 9.5 10*3/uL — ABNORMAL HIGH (ref 1.7–7.7)
Neutrophils Relative %: 64 % (ref 43–77)
RBC: 3.94 MIL/uL (ref 3.87–5.11)
WBC: 14.7 10*3/uL — ABNORMAL HIGH (ref 4.0–10.5)

## 2012-09-23 LAB — URINALYSIS, ROUTINE W REFLEX MICROSCOPIC
Glucose, UA: NEGATIVE mg/dL
Leukocytes, UA: NEGATIVE
Nitrite: NEGATIVE
pH: 5.5 (ref 5.0–8.0)

## 2012-09-23 LAB — RAPID URINE DRUG SCREEN, HOSP PERFORMED
Barbiturates: NOT DETECTED
Cocaine: NOT DETECTED

## 2012-09-23 LAB — SODIUM, URINE, RANDOM: Sodium, Ur: 30 mEq/L

## 2012-09-23 LAB — CREATININE, URINE, RANDOM: Creatinine, Urine: 172.71 mg/dL

## 2012-09-23 LAB — MRSA PCR SCREENING: MRSA by PCR: NEGATIVE

## 2012-09-23 LAB — D-DIMER, QUANTITATIVE: D-Dimer, Quant: 0.78 ug/mL-FEU — ABNORMAL HIGH (ref 0.00–0.48)

## 2012-09-23 MED ORDER — SODIUM CHLORIDE 0.9 % IV BOLUS (SEPSIS)
1000.0000 mL | Freq: Once | INTRAVENOUS | Status: AC
  Administered 2012-09-23: 1000 mL via INTRAVENOUS

## 2012-09-23 MED ORDER — ALPRAZOLAM 1 MG PO TABS
1.0000 mg | ORAL_TABLET | Freq: Three times a day (TID) | ORAL | Status: DC
  Administered 2012-09-23 – 2012-09-26 (×10): 1 mg via ORAL
  Filled 2012-09-23: qty 2
  Filled 2012-09-23: qty 1
  Filled 2012-09-23 (×2): qty 2
  Filled 2012-09-23 (×2): qty 1
  Filled 2012-09-23 (×2): qty 2
  Filled 2012-09-23 (×2): qty 1
  Filled 2012-09-23: qty 2

## 2012-09-23 MED ORDER — MOMETASONE FURO-FORMOTEROL FUM 100-5 MCG/ACT IN AERO
2.0000 | INHALATION_SPRAY | Freq: Two times a day (BID) | RESPIRATORY_TRACT | Status: DC
  Administered 2012-09-23 – 2012-09-26 (×4): 2 via RESPIRATORY_TRACT
  Filled 2012-09-23: qty 8.8

## 2012-09-23 MED ORDER — HEPARIN SODIUM (PORCINE) 5000 UNIT/ML IJ SOLN
5000.0000 [IU] | Freq: Three times a day (TID) | INTRAMUSCULAR | Status: DC
  Administered 2012-09-23 – 2012-09-26 (×8): 5000 [IU] via SUBCUTANEOUS
  Filled 2012-09-23 (×9): qty 1

## 2012-09-23 MED ORDER — ALBUTEROL SULFATE (5 MG/ML) 0.5% IN NEBU
5.0000 mg | INHALATION_SOLUTION | Freq: Once | RESPIRATORY_TRACT | Status: AC
  Administered 2012-09-23: 5 mg via RESPIRATORY_TRACT
  Filled 2012-09-23: qty 1

## 2012-09-23 MED ORDER — ASPIRIN 81 MG PO CHEW
324.0000 mg | CHEWABLE_TABLET | Freq: Once | ORAL | Status: AC
  Administered 2012-09-23: 324 mg via ORAL
  Filled 2012-09-23: qty 1

## 2012-09-23 MED ORDER — ONDANSETRON HCL 4 MG PO TABS
4.0000 mg | ORAL_TABLET | Freq: Four times a day (QID) | ORAL | Status: DC | PRN
  Administered 2012-09-23: 4 mg via ORAL
  Filled 2012-09-23: qty 1

## 2012-09-23 MED ORDER — FENTANYL CITRATE 0.05 MG/ML IJ SOLN
50.0000 ug | Freq: Once | INTRAMUSCULAR | Status: AC
  Administered 2012-09-23: 50 ug via INTRAVENOUS
  Filled 2012-09-23: qty 2

## 2012-09-23 MED ORDER — SODIUM CHLORIDE 0.9 % IJ SOLN
3.0000 mL | Freq: Two times a day (BID) | INTRAMUSCULAR | Status: DC
  Administered 2012-09-23 – 2012-09-25 (×3): 3 mL via INTRAVENOUS

## 2012-09-23 MED ORDER — IPRATROPIUM BROMIDE 0.02 % IN SOLN
0.5000 mg | Freq: Once | RESPIRATORY_TRACT | Status: AC
  Administered 2012-09-23: 0.5 mg via RESPIRATORY_TRACT
  Filled 2012-09-23: qty 2.5

## 2012-09-23 MED ORDER — TRAMADOL HCL 50 MG PO TABS
50.0000 mg | ORAL_TABLET | Freq: Two times a day (BID) | ORAL | Status: DC | PRN
  Administered 2012-09-23 – 2012-09-24 (×2): 50 mg via ORAL
  Filled 2012-09-23 (×2): qty 1

## 2012-09-23 MED ORDER — NICOTINE 14 MG/24HR TD PT24
14.0000 mg | MEDICATED_PATCH | Freq: Every day | TRANSDERMAL | Status: DC
  Administered 2012-09-23 – 2012-09-24 (×2): 14 mg via TRANSDERMAL
  Filled 2012-09-23 (×2): qty 1

## 2012-09-23 MED ORDER — ONDANSETRON HCL 4 MG/2ML IJ SOLN
4.0000 mg | Freq: Four times a day (QID) | INTRAMUSCULAR | Status: DC | PRN
  Filled 2012-09-23: qty 2

## 2012-09-23 MED ORDER — OXYCODONE HCL 5 MG PO TABS
5.0000 mg | ORAL_TABLET | ORAL | Status: DC | PRN
  Administered 2012-09-23 – 2012-09-26 (×10): 5 mg via ORAL
  Filled 2012-09-23 (×11): qty 1

## 2012-09-23 MED ORDER — SODIUM CHLORIDE 0.9 % IV SOLN
INTRAVENOUS | Status: DC
  Administered 2012-09-23: 13:00:00 via INTRAVENOUS
  Administered 2012-09-23: 1000 mL via INTRAVENOUS
  Administered 2012-09-24 – 2012-09-26 (×5): via INTRAVENOUS

## 2012-09-23 NOTE — ED Notes (Signed)
Pt out of room for test

## 2012-09-23 NOTE — ED Notes (Signed)
Chest pain began last night while putting up Christmas tree. Constant pain chest pain since 0600, a little sharper at times, per pt

## 2012-09-23 NOTE — Consult Note (Signed)
Reason for Consult: Acute kidney injury Referring Physician: Dr. Gordy Councilman is an 36 y.o. female.  HPI: She is a patient who has history of her her COPD, hypertension and obesity presently and came with complaints of her her chest pain starting from her neck to get him to her back her abdomen. And she describes this pain to be very severe. Patient says that she has pain from before and has been taking multiple medications for some time. Presently the pain is not relieved with the medication she is taking and at this moment she is asking for narcotics to be given. Throughout my discussion her main interest was about adding a different pain medication. Presently she denies any nausea vomiting however she has abdominal pain and poor appetite. She denies any previous history of renal failure or kidney stone.  Past Medical History  Diagnosis Date  . COPD (chronic obstructive pulmonary disease)   . Hypertension   . Gall stones     Past Surgical History  Procedure Date  . Cesarean section   . Abdominal hysterectomy   . Tubal ligation     Family History  Problem Relation Age of Onset  . Diabetes Mother   . Hypertension Father     Social History:  reports that she has been smoking Cigarettes.  She has been smoking about 1 pack per day. She does not have any smokeless tobacco history on file. She reports that she drinks alcohol. She reports that she does not use illicit drugs.  Allergies:  Allergies  Allergen Reactions  . Latex Hives    Medications: I have reviewed the patient's current medications.  Results for orders placed during the hospital encounter of 09/23/12 (from the past 48 hour(s))  CBC WITH DIFFERENTIAL     Status: Abnormal   Collection Time   09/23/12  8:00 AM      Component Value Range Comment   WBC 14.7 (*) 4.0 - 10.5 K/uL    RBC 3.94  3.87 - 5.11 MIL/uL    Hemoglobin 12.6  12.0 - 15.0 g/dL    HCT 16.1  09.6 - 04.5 %    MCV 94.7  78.0 - 100.0 fL    MCH  32.0  26.0 - 34.0 pg    MCHC 33.8  30.0 - 36.0 g/dL    RDW 40.9  81.1 - 91.4 %    Platelets 289  150 - 400 K/uL    Neutrophils Relative 64  43 - 77 %    Lymphocytes Relative 28  12 - 46 %    Monocytes Relative 7  3 - 12 %    Eosinophils Relative 1  0 - 5 %    Basophils Relative 0  0 - 1 %    Neutro Abs 9.5 (*) 1.7 - 7.7 K/uL    Lymphs Abs 4.1 (*) 0.7 - 4.0 K/uL    Monocytes Absolute 1.0  0.1 - 1.0 K/uL    Eosinophils Absolute 0.1  0.0 - 0.7 K/uL    Basophils Absolute 0.0  0.0 - 0.1 K/uL    WBC Morphology ATYPICAL LYMPHOCYTES     BASIC METABOLIC PANEL     Status: Abnormal   Collection Time   09/23/12  8:00 AM      Component Value Range Comment   Sodium 134 (*) 135 - 145 mEq/L    Potassium 3.7  3.5 - 5.1 mEq/L    Chloride 95 (*) 96 - 112 mEq/L  CO2 21  19 - 32 mEq/L    Glucose, Bld 118 (*) 70 - 99 mg/dL    BUN 61 (*) 6 - 23 mg/dL    Creatinine, Ser 9.56 (*) 0.50 - 1.10 mg/dL    Calcium 9.5  8.4 - 21.3 mg/dL    GFR calc non Af Amer 7 (*) >90 mL/min    GFR calc Af Amer 8 (*) >90 mL/min   TROPONIN I     Status: Normal   Collection Time   09/23/12  8:00 AM      Component Value Range Comment   Troponin I <0.30  <0.30 ng/mL   D-DIMER, QUANTITATIVE     Status: Abnormal   Collection Time   09/23/12  8:00 AM      Component Value Range Comment   D-Dimer, Quant 0.78 (*) 0.00 - 0.48 ug/mL-FEU   URINE RAPID DRUG SCREEN (HOSP PERFORMED)     Status: Abnormal   Collection Time   09/23/12 11:51 AM      Component Value Range Comment   Opiates POSITIVE (*) NONE DETECTED    Cocaine NONE DETECTED  NONE DETECTED    Benzodiazepines POSITIVE (*) NONE DETECTED    Amphetamines POSITIVE (*) NONE DETECTED    Tetrahydrocannabinol POSITIVE (*) NONE DETECTED    Barbiturates NONE DETECTED  NONE DETECTED     Dg Chest 2 View  09/23/2012  *RADIOLOGY REPORT*  Clinical Data: Chest pain and wheezing.  CHEST - 2 VIEW  Comparison: 11/06/2011.  Findings: The cardiac silhouette, mediastinal and hilar contours  are within normal limits and stable.  The lungs demonstrate mild bronchitic changes, likely related to smoking.  No infiltrates, edema or effusions.  IMPRESSION: Mild chronic bronchitic type lung changes, likely relates to smoking.  No definite infiltrates or effusions.   Original Report Authenticated By: Rudie Meyer, M.D.    US Renal  09/23/2012  *RADIOLOGY REPORT*  Clinical Data: Acute renal failure.  RENAL/URINARY TRACT ULTRASOUND COMPLETE  Comparison:  CT abdomen and pelvis 11/04/2009.  Findings:  Right Kidney:  Measures 10.0 cm and appears normal without stone, mass or hydronephrosis.  Left Kidney:  Measures 11.6 cm and appears normal without stone, mass or hydronephrosis.  Bladder:  Unremarkable.  IMPRESSION: Negative for hydronephrosis.  Negative study.   Original Report Authenticated By: Holley Dexter, M.D.    US Venous Img Lower Bilateral  09/23/2012  *RADIOLOGY REPORT*  Clinical Data: Bilateral lower extremity swelling.  BILATERAL LOWER EXTREMITY VENOUS DUPLEX ULTRASOUND  Technique:  Gray-scale sonography with graded compression, as well as color Doppler and duplex ultrasound, were performed to evaluate the deep venous system of both lower extremities from the level of the common femoral vein through the popliteal and proximal calf veins.  Spectral Doppler was utilized to evaluate flow at rest and with distal augmentation maneuvers.  Comparison:  Ultrasound 07/22/2010.  Findings:  Normal compressibility of bilateral common femoral, superficial femoral, and popliteal veins is demonstrated, as well as the visualized proximal calf veins.  No filling defects to suggest DVT on grayscale or color Doppler imaging.  Doppler waveforms show normal direction of venous flow, normal respiratory phasicity and response to augmentation.  IMPRESSION: No evidence of deep vein thrombosis in either lower extremity.   Original Report Authenticated By: Holley Dexter, M.D.     Review of Systems  Constitutional:  Negative for fever.  Respiratory: Positive for wheezing.   Cardiovascular: Positive for chest pain and leg swelling.  Gastrointestinal: Positive for abdominal pain. Negative for nausea  and vomiting.  Musculoskeletal: Positive for back pain and joint pain.  Neurological: Positive for headaches.   Blood pressure 87/54, pulse 93, temperature 98.2 F (36.8 C), temperature source Oral, resp. rate 16, height 5\' 7"  (1.702 m), weight 150 kg (330 lb 11 oz), SpO2 99.00%. Physical Exam  Constitutional: She is oriented to person, place, and time. No distress.  Eyes: No scleral icterus.  Neck: No JVD present.  Cardiovascular: Normal rate and regular rhythm.   No murmur heard. Respiratory: No respiratory distress. She has wheezes. She has no rales. She exhibits no tenderness.  GI: Soft. Bowel sounds are normal. She exhibits no distension. There is no rebound.  Neurological: She is alert and oriented to person, place, and time.    Assessment/Plan: Problem #1 acute kidney injury at this moment seems to be multifactorial including nonsteroidal specially ibuprofen which she  is taking more than 4 tablets a day for the last 4-5 years. At this moment patient also has hypotension hence we may be dealing with superimposed ATN versus prerenal syndrome. Her ultrasound showed right kidney to be 10 cm in length 11.6 but no hydro-or kidney stone. Problem #2 hypotension etiology not clear however in a patient was elevated white cell count and hypotension possibility of sepsis seems to be high. Since patient was also on antihypertensive medication and diuretics dehydration could also may contribute to the problem of hypotension Problem #3 history of morbid obesity Problem #4 history of polysubstance abuse presently patient denies using cocaine but uses marijuana once in a while. Problem #5 history of chronic pain syndrome with pain medication seeking behavior. Problem #6 anxiety disorder Problem #7 COPD patient  continues to smoke. Problem #8 history of chest pain including her abdomen and flank etiology as this moment is not clear. Plan: We'll check her UA We'll check basic metabolic panel, ANA, complement,ANCA and ASO titer. We'll check fractional excretion of sodium.  Katherina Wimer S 09/23/2012, 3:26 PM

## 2012-09-23 NOTE — ED Provider Notes (Signed)
History  This chart was scribed for Charles B. Bernette Mayers, MD by Erskine Emery, ED Scribe. This patient was seen in room APA19/AP[facility]19 and the patient's care was started at 07:44.   CSN: [id number]  Arrival date & time 09/23/12  1478   First MD Initiated Contact with Patient 09/23/12 [phone]      Chief Complaint  Patient presents with  . Chest Pain    (Consider location/radiation/quality/duration/timing/severity/associated sxs/prior treatment) The history is provided by the patient. No language interpreter was used.  Nancy Lynn is a 36 y.o. female who presents to the Emergency Department complaining of chest pain that radiates to the neck, left flank, rib cage, and back since around 6am this morning, after staying up all night with her sister, putting up the Christmas tree. Pt describes the pain, "like a dagger" or "like someone is sitting on my chest." Pt denies any associated fever but reports some cough and SOB. Pt has not been on any recent long trips and she is not on hormone medication (she has a h/o hysterectomy). Pt has a h/o gall stones and states this pain feels like previous episodes with her gall stones but it hasn't gone away as quickly as those episodes. Pt also has a h/o COPD and HTN. She has an albuterol inhaler that she uses as needed, that has provided no relief. Pt still smokes, about a pack/day. Pt is prescribed xanax, Advair, lasix, neurontin, lisinopril, and zanaflex, but she reports she does not always take her medication as prescribed.  Dr. Jorene Guest was the pt's PCP but he just retired.  Past Medical History  Diagnosis Date  . COPD (chronic obstructive pulmonary disease)   . Hypertension   . Gall stones     Past Surgical History  Procedure Date  . Cesarean section   . Abdominal hysterectomy   . Tubal ligation     Family History  Problem Relation Age of Onset  . Diabetes Mother   . Hypertension Father     History  Substance Use Topics  . Smoking status:  Current Every Day Smoker -- 1.0 packs/day    Types: Cigarettes  . Smokeless tobacco: Not on file  . Alcohol Use: Yes    OB History    Grav Para Term Preterm Abortions TAB SAB Ect Mult Living                  Review of Systems A complete 10 system review of systems was obtained and all systems are negative except as noted in the HPI and PMH.    Allergies  Latex  Home Medications   Current Outpatient Rx  Name  Route  Sig  Dispense  Refill  . ALBUTEROL SULFATE HFA 108 (90 BASE) MCG/ACT IN AERS   Inhalation   Inhale 2 puffs into the lungs daily as needed. For shortness of breath          . ALPRAZOLAM 1 MG PO TABS   Oral   Take 1 mg by mouth 3 (three) times daily.           Marland Kitchen FLUTICASONE-SALMETEROL 250-50 MCG/DOSE IN AEPB   Inhalation   Inhale 1 puff into the lungs every 12 (twelve) hours.           . FUROSEMIDE 40 MG PO TABS   Oral   Take 40 mg by mouth daily.           Marland Kitchen GABAPENTIN 600 MG PO TABS   Oral   Take  600 mg by mouth 3 (three) times daily.          . IBUPROFEN 800 MG PO TABS   Oral   Take 800 mg by mouth every 8 (eight) hours as needed. For pain          . LISINOPRIL 20 MG PO TABS   Oral   Take 20 mg by mouth daily.           Marland Kitchen TIZANIDINE HCL 2 MG PO TABS   Oral   Take 2 mg by mouth 3 (three) times daily.           Triage Vitals: BP 95/56  Pulse 100  Temp 97.7 F (36.5 C) (Oral)  Resp 18  Ht 5\' 7"  (1.702 m)  Wt 320 lb (145.151 kg)  BMI 50.12 kg/m2  SpO2 99%  Physical Exam  Nursing note and vitals reviewed. Constitutional: She is oriented to person, place, and time. She appears well-developed and well-nourished.  HENT:  Head: Normocephalic and atraumatic.  Eyes: EOM are normal. Pupils are equal, round, and reactive to light.  Neck: Normal range of motion. Neck supple.  Cardiovascular: Normal rate, normal heart sounds and intact distal pulses.   Pulmonary/Chest: Effort normal. She has wheezes. She exhibits tenderness.        Tender over sternum. Wheezing in all fields.  Abdominal: Bowel sounds are normal. She exhibits no distension. There is no tenderness.  Musculoskeletal: Normal range of motion. She exhibits no edema and no tenderness.  Neurological: She is alert and oriented to person, place, and time. She has normal strength. No cranial nerve deficit or sensory deficit.  Skin: Skin is warm and dry. No rash noted.  Psychiatric: She has a normal mood and affect.    ED Course  Procedures (including critical care time) DIAGNOSTIC STUDIES: Oxygen Saturation is 99% on room air, normal by my interpretation.    COORDINATION OF CARE: 07:50am--I evaluated the patient and we discussed a treatment plan including blood work, EKG, chest x-ray, and medication (aspirin, albuterol, and Atrovent) to which the pt agreed.   09:09am--I rechecked the pt and notified her that the amount of ibuprofen she has been taking over the last several years has put her at risk of renal failure that that she will be admitted to the hospital.   09:21--I rechecked the pt who requests pain medication.   Labs Reviewed  CBC WITH DIFFERENTIAL - Abnormal; Notable for the following:    WBC 14.7 (*)     Neutro Abs 9.5 (*)     Lymphs Abs 4.1 (*)     All other components within normal limits  BASIC METABOLIC PANEL - Abnormal; Notable for the following:    Sodium 134 (*)     Chloride 95 (*)     Glucose, Bld 118 (*)     BUN 61 (*)     Creatinine, Ser 6.83 (*)     GFR calc non Af Amer 7 (*)     GFR calc Af Amer 8 (*)     All other components within normal limits  D-DIMER, QUANTITATIVE - Abnormal; Notable for the following:    D-Dimer, Quant 0.78 (*)     All other components within normal limits  TROPONIN I   Dg Chest 2 View  09/23/2012  *RADIOLOGY REPORT*  Clinical Data: Chest pain and wheezing.  CHEST - 2 VIEW  Comparison: 11/06/2011.  Findings: The cardiac silhouette, mediastinal and hilar contours are within normal limits and stable.  The lungs demonstrate mild bronchitic changes, likely related to smoking.  No infiltrates, edema or effusions.  IMPRESSION: Mild chronic bronchitic type lung changes, likely relates to smoking.  No definite infiltrates or effusions.   Original Report Authenticated By: Rudie Meyer, M.D.      No diagnosis found.    MDM   Date: 09/23/2012  Rate: 103  Rhythm: sinus tachycardia  QRS Axis: normal  Intervals: normal  ST/T Wave abnormalities: normal  Conduction Disutrbances:none  Narrative Interpretation:   Old EKG Reviewed: unchanged  Labs reviewed, patient in acute renal failure. She reports she has been taking Motrin 800mg  4-6 times per day for the last several months. This is likely the cause. Will begin IVF bolus, anticipate admission, however patient wants to discuss with husband before deciding what to do.      I personally performed the services described in this documentation, which was scribed in my presence. The recorded information has been reviewed and is accurate.     Charles B. Bernette Mayers, MD 09/23/12 [phone]

## 2012-09-23 NOTE — Progress Notes (Signed)
PT BECOMING VERY BELIGERANT ABOUT HOSPITAL RULES CONCERNING NO SMOKING, VISITORS AND NOT OBTAINING PAIN AND NERVE MEDS SHE WANTS.

## 2012-09-23 NOTE — H&P (Addendum)
[facility] History and Physical  Nancy Lynn ZOX:[id number] DOB: November 27, 1975 DOA: 09/23/2012  Referring physician: Dr. Bernette Mayers, ER physician. PCP: Reynolds Bowl, MD    Chief Complaint: Central chest pain, acute renal failure.  HPI: Nancy Lynn is a 36 y.o. female presents with a sudden onset of central chest pain approximately 6 AM today. The pain is still present and it felt like a dull crushing pain as if someone was sitting on her chest. She also has had poor appetite for the last 3-4 days. She denies any nausea or vomiting. She normally takes ibuprofen 800 mg tablets 4 times a day but on occasion also more than this. She takes this for chronic pain. She apparently has a history of polysubstance drug abuse in the past, apparently including cocaine, although she vehemently denies this. When she was evaluated in the emergency room, she was found to be in acute renal failure and also an elevation in d-dimer. We are now asked to admit this patient.   Review of Systems: Apart from history of present illness, other systems negative.  Past Medical History  Diagnosis Date  . COPD (chronic obstructive pulmonary disease)   . Hypertension   . Gall stones    Past Surgical History  Procedure Date  . Cesarean section   . Abdominal hysterectomy   . Tubal ligation    Social History:  She lives with her fianc of 12 years. She smokes one pack of cigarettes per day. She denies drinking alcohol. She denies any drug abuse, she does admit to marijuana in the past but vehemently denies the use of cocaine. 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  Allergies  Allergen Reactions  . Latex Hives    Family History  Problem Relation Age of Onset  . Diabetes Mother   . Hypertension Father    no family history of kidney disease.  Prior to Admission medications   Medication  Sig Start Date End Date Taking? Authorizing Provider  albuterol (VENTOLIN HFA) 108 (90 BASE) MCG/ACT inhaler Inhale 2 puffs into the lungs daily as needed. For shortness of breath     Historical Provider, MD  ALPRAZolam Prudy Feeler) 1 MG tablet Take 1 mg by mouth 3 (three) times daily.      Historical Provider, MD  Fluticasone-Salmeterol (ADVAIR) 250-50 MCG/DOSE AEPB Inhale 1 puff into the lungs every 12 (twelve) hours.      Historical Provider, MD  furosemide (LASIX) 40 MG tablet Take 40 mg by mouth daily.      Historical Provider, MD  gabapentin (NEURONTIN) 600 MG tablet Take 600 mg by mouth 3 (three) times daily.     Historical Provider, MD  ibuprofen (ADVIL,MOTRIN) 800 MG tablet Take 800 mg by mouth every 8 (eight) hours as needed. For pain     Historical Provider, MD  lisinopril (PRINIVIL,ZESTRIL) 20 MG tablet Take 20 mg by mouth daily.      Historical Provider, MD  tiZANidine (ZANAFLEX) 2 MG tablet Take 2 mg by mouth 3 (three) times daily.    Historical Provider, MD   Physical Exam: Filed Vitals:   09/23/12 0740 09/23/12 0831 09/23/12 0937  BP: 95/56  91/51  Pulse: 100  103  Temp: 97.7 F (36.5 C)    TempSrc: Oral    Resp: 18  20  Height: 5\' 7"  (1.702 m)    Weight: 145.151 kg (320  lb)    SpO2: 99% 100% 100%     General:  She looks systemically well. She is anxious. She is obese morbidly.  Eyes: No pallor. No jaundice.  ENT: No major abnormalities at this time.  Neck: No neck lymphadenopathy.  Cardiovascular: Heart sounds are present without murmurs or gallop rhythm.  Respiratory: Lung fields are clear.  Abdomen: Soft, nontender, no masses felt. Obese abdomen and therefore difficult to examine.  Skin: No rashes.  Musculoskeletal: Tender in the anterior chest wall, reproducing her pain.  Psychiatric: Anxious.  Neurologic: Alert and orientated without any focal neurological signs.  Labs on Admission:  Basic Metabolic Panel:  Lab 09/23/12 0981  NA 134*  K 3.7  CL 95*   CO2 21  GLUCOSE 118*  BUN 61*  CREATININE 6.83*  CALCIUM 9.5  MG --  PHOS --       CBC:  Lab 09/23/12 0800  WBC 14.7*  NEUTROABS 9.5*  HGB 12.6  HCT 37.3  MCV 94.7  PLT 289   Cardiac Enzymes:  Lab 09/23/12 0800  CKTOTAL --  CKMB --  CKMBINDEX --  TROPONINI <0.30      Radiological Exams on Admission: Dg Chest 2 View  09/23/2012  *RADIOLOGY REPORT*  Clinical Data: Chest pain and wheezing.  CHEST - 2 VIEW  Comparison: 11/06/2011.  Findings: The cardiac silhouette, mediastinal and hilar contours are within normal limits and stable.  The lungs demonstrate mild bronchitic changes, likely related to smoking.  No infiltrates, edema or effusions.  IMPRESSION: Mild chronic bronchitic type lung changes, likely relates to smoking.  No definite infiltrates or effusions.   Original Report Authenticated By: Rudie Meyer, M.D.     EKG: Independently reviewed. Normal sinus rhythm, no acute ST-T wave changes.  Assessment/Plan Principal Problem:  *ARF (acute renal failure) Active Problems:  HTN (hypertension)  Obesity  Chest pain at rest  COPD (chronic obstructive pulmonary disease)  Tobacco abuse   1. Chest pain, likely musculoskeletal in nature. 2. Acute renal failure, probably induced by combination of over use of ibuprofen, Lasix and ACE inhibitor. 3. Hypertension, currently hypotensive. 4. COPD, stable. 5. Morbid obesity. 6. Tobacco abuse. 7. History of polysubstance drug abuse. 8. Anxiety. 9. Elevated d-dimer. Plan: 1. Admit to step down unit in view of hypotension. 2. Aggressive intravenous fluid resuscitation. 3. Discontinue lisinopril, Lasix and other medications which are nephrotoxic. No further ibuprofen. 4. Nephrology consultation. 5. Ultrasound of the kidneys. 6. Venous Doppler of both legs to see if there is evidence of DVT. In view of her acute renal failure, a CT angiogram cannot be done and she does describe swelling of her legs. I do not think  clinically she has a pulmonary embolism, and therefore we'll use prophylactic heparin instead of therapeutic doses, unless she has proven DVT. 7. Urine drug screen.    Code Status: Full code.   Family Communication: Discussed plan with patient at the bedside.   Disposition Plan: Home in medically stable.  Time spent: 45 minutes.  Wilson Singer [facility] Pager [phone].  If 7PM-7AM, please contact night-coverage [email or web address] Password [email or web address] 09/23/2012, 9:56 AM

## 2012-09-23 NOTE — ED Notes (Signed)
Pt return from x-ray. Requesting her pain med and xanax. Pt has no pain med ordered. Made aware. States she has to have her pain medication

## 2012-09-23 NOTE — ED Notes (Signed)
Pt eval by hospitalist for admission

## 2012-09-23 NOTE — Progress Notes (Signed)
PT ASKING LOT OF QUESTIONS CONCERNING DX AND TX PLAN.ALL QUESTIONS ANSWERED AS COMPLETELY AS POSSIBLE.

## 2012-09-24 DIAGNOSIS — K5900 Constipation, unspecified: Secondary | ICD-10-CM | POA: Diagnosis present

## 2012-09-24 DIAGNOSIS — I959 Hypotension, unspecified: Secondary | ICD-10-CM | POA: Diagnosis present

## 2012-09-24 DIAGNOSIS — F1910 Other psychoactive substance abuse, uncomplicated: Secondary | ICD-10-CM | POA: Diagnosis present

## 2012-09-24 LAB — CBC
Hemoglobin: 11.1 g/dL — ABNORMAL LOW (ref 12.0–15.0)
MCH: 31.4 pg (ref 26.0–34.0)
MCHC: 33.1 g/dL (ref 30.0–36.0)
Platelets: 256 10*3/uL (ref 150–400)

## 2012-09-24 LAB — COMPREHENSIVE METABOLIC PANEL
ALT: 11 U/L (ref 0–35)
AST: 13 U/L (ref 0–37)
CO2: 22 mEq/L (ref 19–32)
Calcium: 9 mg/dL (ref 8.4–10.5)
Chloride: 101 mEq/L (ref 96–112)
Creatinine, Ser: 4.07 mg/dL — ABNORMAL HIGH (ref 0.50–1.10)
GFR calc Af Amer: 15 mL/min — ABNORMAL LOW (ref >90)
GFR calc non Af Amer: 13 mL/min — ABNORMAL LOW (ref >90)
Glucose, Bld: 104 mg/dL — ABNORMAL HIGH (ref 70–99)
Sodium: 134 mEq/L — ABNORMAL LOW (ref 135–145)
Total Bilirubin: 0.1 mg/dL — ABNORMAL LOW (ref 0.3–1.2)

## 2012-09-24 LAB — PHOSPHORUS: Phosphorus: 6.4 mg/dL — ABNORMAL HIGH (ref 2.3–4.6)

## 2012-09-24 MED ORDER — MOMETASONE FURO-FORMOTEROL FUM 100-5 MCG/ACT IN AERO: 2.0000 | INHALATION_SPRAY | Freq: Two times a day (BID) | RESPIRATORY_TRACT | Status: DC

## 2012-09-24 MED ORDER — GABAPENTIN 400 MG PO CAPS
400.0000 mg | ORAL_CAPSULE | Freq: Three times a day (TID) | ORAL | Status: DC
  Administered 2012-09-24 – 2012-09-26 (×7): 400 mg via ORAL
  Filled 2012-09-24 (×7): qty 1

## 2012-09-24 MED ORDER — POLYETHYLENE GLYCOL 3350 17 G PO PACK
17.0000 g | PACK | Freq: Two times a day (BID) | ORAL | Status: DC
  Administered 2012-09-24 – 2012-09-25 (×4): 17 g via ORAL
  Filled 2012-09-24 (×5): qty 1

## 2012-09-24 MED ORDER — NICOTINE 21 MG/24HR TD PT24
21.0000 mg | MEDICATED_PATCH | Freq: Every day | TRANSDERMAL | Status: DC
  Administered 2012-09-24 – 2012-09-25 (×2): 21 mg via TRANSDERMAL
  Filled 2012-09-24 (×2): qty 1

## 2012-09-24 NOTE — Progress Notes (Signed)
PT AND HER FAMILY ARE ASKING IF SHE CAN USE ELECTRONIC CIGARRETE. INFORMED THAT THAT TOO IS AGAINST [place] POLICY/ PT IGVEN SEVERAL PIECES OF GUM TO CHEW.

## 2012-09-24 NOTE — Progress Notes (Signed)
PT AND RN WALKED FROM ONE END OF SECOND FLOOR TO THE OTHER.PT TOLERATED WELL. NO DYSPNEA,CHEST PAIN,OR WEAKNESS, HR REMAINED 90'S TO 100'S. BP ON RETURN TO ROOM WAS 123/59.PT'S FAMILY REMAINS IN THE ROOM W/ HER.

## 2012-09-24 NOTE — Progress Notes (Signed)
Subjective: Interval History: has no complaint of nausea or vomiting. Patient is very somnolent arousable but goes back to sleep hence has this moment does not keep conversation..  Objective: Vital signs in last 24 hours: Temp:  [97.7 F (36.5 C)-98.2 F (36.8 C)] 98 F (36.7 C) (12/08 0403) Pulse Rate:  [90-104] 90  (12/08 0008) Resp:  [11-21] 16  (12/08 0500) BP: (61-118)/(33-74) 79/52 mmHg (12/08 0500) SpO2:  [96 %-100 %] 98 % (12/08 0730) Weight:  [150 kg (330 lb 11 oz)-156 kg (343 lb 14.7 oz)] 156 kg (343 lb 14.7 oz) (12/08 0519) Weight change:   Intake/Output from previous day: 12/07 0701 - 12/08 0700 In: 5080 [P.O.:1680; I.V.:2400; IV Piggyback:1000] Out: 3025 [Urine:3025] Intake/Output this shift:    Patient presently seems to be very sleepy but arousable. At this moment she doesn't seem to be in any apparent distress. Most likely secondary to a medication. Chest is clear to auscultation Heart exam is regular rhythm Extremities no edema  Lab Results:  Basename 09/24/12 0500 09/23/12 0800  WBC 9.1 14.7*  HGB 11.1* 12.6  HCT 33.5* 37.3  PLT 256 289   BMET:  Basename 09/24/12 0500 09/23/12 1522  NA 134* 131*  K 3.7 3.4*  CL 101 94*  CO2 22 22  GLUCOSE 104* 105*  BUN 56* 60*  CREATININE 4.07* 6.01*  CALCIUM 9.0 9.4   No results found for this basename: PTH:2 in the last 72 hours Iron Studies: No results found for this basename: IRON,TIBC,TRANSFERRIN,FERRITIN in the last 72 hours  Studies/Results: Dg Chest 2 View  09/23/2012  *RADIOLOGY REPORT*  Clinical Data: Chest pain and wheezing.  CHEST - 2 VIEW  Comparison: 11/06/2011.  Findings: The cardiac silhouette, mediastinal and hilar contours are within normal limits and stable.  The lungs demonstrate mild bronchitic changes, likely related to smoking.  No infiltrates, edema or effusions.  IMPRESSION: Mild chronic bronchitic type lung changes, likely relates to smoking.  No definite infiltrates or effusions.    Original Report Authenticated By: Rudie Meyer, M.D.    US Renal  09/23/2012  *RADIOLOGY REPORT*  Clinical Data: Acute renal failure.  RENAL/URINARY TRACT ULTRASOUND COMPLETE  Comparison:  CT abdomen and pelvis 11/04/2009.  Findings:  Right Kidney:  Measures 10.0 cm and appears normal without stone, mass or hydronephrosis.  Left Kidney:  Measures 11.6 cm and appears normal without stone, mass or hydronephrosis.  Bladder:  Unremarkable.  IMPRESSION: Negative for hydronephrosis.  Negative study.   Original Report Authenticated By: Holley Dexter, M.D.    US Venous Img Lower Bilateral  09/23/2012  *RADIOLOGY REPORT*  Clinical Data: Bilateral lower extremity swelling.  BILATERAL LOWER EXTREMITY VENOUS DUPLEX ULTRASOUND  Technique:  Gray-scale sonography with graded compression, as well as color Doppler and duplex ultrasound, were performed to evaluate the deep venous system of both lower extremities from the level of the common femoral vein through the popliteal and proximal calf veins.  Spectral Doppler was utilized to evaluate flow at rest and with distal augmentation maneuvers.  Comparison:  Ultrasound 07/22/2010.  Findings:  Normal compressibility of bilateral common femoral, superficial femoral, and popliteal veins is demonstrated, as well as the visualized proximal calf veins.  No filling defects to suggest DVT on grayscale or color Doppler imaging.  Doppler waveforms show normal direction of venous flow, normal respiratory phasicity and response to augmentation.  IMPRESSION: No evidence of deep vein thrombosis in either lower extremity.   Original Report Authenticated By: Holley Dexter, M.D.  I have reviewed the patient's current medications.  Assessment/Plan: Problem #1 acute kidney injury at this moment seems to be secondary to nonsteroidal and also prerenal syndrome pending creatinine is improving she is a good urine output. Problem #2 COPD  Problem #3 hypotension etiology not clear.  Chest x-ray no infiltrate and the urine as this moment seems to be clear. Her white cell count has improved. Problem #4 obesity Problem #5 chest pain patient presently seems to be comfortable. Patient wheeze previous history of chronic pain and on multiple pain medications. Problem #6 anxiety disorder Problem #7 anemia. Plan: We'll decrease her IV fluid to 135 cc per hour We'll check her basic metabolic panel in the morning.   LOS: 1 day   Visente Kirker S 09/24/2012,8:11 AM

## 2012-09-24 NOTE — Progress Notes (Signed)
Chart reviewed.  Subjective: Constipated. Pain better.  Patient and family with multiple questions. No dizziness.  Objective: Vital signs in last 24 hours: Filed Vitals:   09/24/12 0403 09/24/12 0500 09/24/12 0519 09/24/12 0730  BP:  79/52    Pulse:      Temp: 98 F (36.7 C)     TempSrc: Oral     Resp:  16    Height:      Weight:   156 kg (343 lb 14.7 oz)   SpO2:    98%   Weight change:   Intake/Output Summary (Last 24 hours) at 09/24/12 1005 Last data filed at 09/24/12 0610  Gross per 24 hour  Intake   5080 ml  Output   3025 ml  Net   2055 ml   General: Alert, oriented and comfortable, morbidly obese Lungs clear to auscultation bilaterally without wheeze rhonchi or rales Cardiovascular regular rate rhythm without murmurs gallops rubs Abdomen obese soft nontender Extremities no clubbing cyanosis or edema  Lab Results: Basic Metabolic Panel:  Lab 09/24/12 1478 09/23/12 1522  NA 134* 131*  K 3.7 3.4*  CL 101 94*  CO2 22 22  GLUCOSE 104* 105*  BUN 56* 60*  CREATININE 4.07* 6.01*  CALCIUM 9.0 9.4  MG -- --  PHOS 6.4* --   Liver Function Tests:  Lab 09/24/12 0500  AST 13  ALT 11  ALKPHOS 73  BILITOT 0.1*  PROT 6.9  ALBUMIN 3.8   No results found for this basename: LIPASE:2,AMYLASE:2 in the last 168 hours No results found for this basename: AMMONIA:2 in the last 168 hours CBC:  Lab 09/24/12 0500 09/23/12 0800  WBC 9.1 14.7*  NEUTROABS -- 9.5*  HGB 11.1* 12.6  HCT 33.5* 37.3  MCV 94.9 94.7  PLT 256 289   Cardiac Enzymes:  Lab 09/23/12 0800  CKTOTAL --  CKMB --  CKMBINDEX --  TROPONINI <0.30   BNP: No results found for this basename: PROBNP:3 in the last 168 hours D-Dimer:  Lab 09/23/12 0800  DDIMER 0.78*   CBG: No results found for this basename: GLUCAP:6 in the last 168 hours Hemoglobin A1C: No results found for this basename: HGBA1C in the last 168 hours Fasting Lipid Panel: No results found for this basename:  CHOL,HDL,LDLCALC,TRIG,CHOLHDL,LDLDIRECT in the last 295 hours Thyroid Function Tests: No results found for this basename: TSH,T4TOTAL,FREET4,T3FREE,THYROIDAB in the last 168 hours Coagulation: No results found for this basename: LABPROT:4,INR:4 in the last 168 hours Anemia Panel: No results found for this basename: VITAMINB12,FOLATE,FERRITIN,TIBC,IRON,RETICCTPCT in the last 168 hours Urine Drug Screen: Drugs of Abuse     Component Value Date/Time   LABOPIA POSITIVE* 09/23/2012 1151   COCAINSCRNUR NONE DETECTED 09/23/2012 1151   LABBENZ POSITIVE* 09/23/2012 1151   AMPHETMU POSITIVE* 09/23/2012 1151   THCU POSITIVE* 09/23/2012 1151   LABBARB NONE DETECTED 09/23/2012 1151    Alcohol Level: No results found for this basename: ETH:2 in the last 168 hours Urinalysis:  Lab 09/23/12 1557  COLORURINE YELLOW  LABSPEC 1.015  PHURINE 5.5  GLUCOSEU NEGATIVE  HGBUR MODERATE*  BILIRUBINUR NEGATIVE  KETONESUR NEGATIVE  PROTEINUR NEGATIVE  UROBILINOGEN 0.2  NITRITE NEGATIVE  LEUKOCYTESUR NEGATIVE   Micro Results: Recent Results (from the past 240 hour(s))  MRSA PCR SCREENING     Status: Normal   Collection Time   09/23/12  2:51 PM      Component Value Range Status Comment   MRSA by PCR NEGATIVE  NEGATIVE Final    Studies/Results: Dg Chest  2 View  09/23/2012  *RADIOLOGY REPORT*  Clinical Data: Chest pain and wheezing.  CHEST - 2 VIEW  Comparison: 11/06/2011.  Findings: The cardiac silhouette, mediastinal and hilar contours are within normal limits and stable.  The lungs demonstrate mild bronchitic changes, likely related to smoking.  No infiltrates, edema or effusions.  IMPRESSION: Mild chronic bronchitic type lung changes, likely relates to smoking.  No definite infiltrates or effusions.   Original Report Authenticated By: Rudie Meyer, M.D.    US Renal  09/23/2012  *RADIOLOGY REPORT*  Clinical Data: Acute renal failure.  RENAL/URINARY TRACT ULTRASOUND COMPLETE  Comparison:  CT abdomen and  pelvis 11/04/2009.  Findings:  Right Kidney:  Measures 10.0 cm and appears normal without stone, mass or hydronephrosis.  Left Kidney:  Measures 11.6 cm and appears normal without stone, mass or hydronephrosis.  Bladder:  Unremarkable.  IMPRESSION: Negative for hydronephrosis.  Negative study.   Original Report Authenticated By: Holley Dexter, M.D.    US Venous Img Lower Bilateral  09/23/2012  *RADIOLOGY REPORT*  Clinical Data: Bilateral lower extremity swelling.  BILATERAL LOWER EXTREMITY VENOUS DUPLEX ULTRASOUND  Technique:  Gray-scale sonography with graded compression, as well as color Doppler and duplex ultrasound, were performed to evaluate the deep venous system of both lower extremities from the level of the common femoral vein through the popliteal and proximal calf veins.  Spectral Doppler was utilized to evaluate flow at rest and with distal augmentation maneuvers.  Comparison:  Ultrasound 07/22/2010.  Findings:  Normal compressibility of bilateral common femoral, superficial femoral, and popliteal veins is demonstrated, as well as the visualized proximal calf veins.  No filling defects to suggest DVT on grayscale or color Doppler imaging.  Doppler waveforms show normal direction of venous flow, normal respiratory phasicity and response to augmentation.  IMPRESSION: No evidence of deep vein thrombosis in either lower extremity.   Original Report Authenticated By: Holley Dexter, M.D.    Scheduled Meds:   . ALPRAZolam  1 mg Oral TID  . heparin  5,000 Units Subcutaneous Q8H  . mometasone-formoterol  2 puff Inhalation BID  . nicotine  14 mg Transdermal Daily  . [COMPLETED] sodium chloride  1,000 mL Intravenous Once  . [COMPLETED] sodium chloride  1,000 mL Intravenous Once  . sodium chloride  3 mL Intravenous Q12H   Continuous Infusions:   . sodium chloride 150 mL/hr at 09/24/12 0500   PRN Meds:.ondansetron (ZOFRAN) IV, ondansetron, oxyCODONE, traMADol Assessment/Plan: Principal  Problem:  *ARF (acute renal failure), likely secondary to medications and hypotension, improving Active Problems:  Hypotension continues despite being off medications. Patient is rather asymptomatic. Will however continue step down monitoring to  HTN (hypertension)  Chest pain at rest, likely musculoskeletal per  Obesity  COPD (chronic obstructive pulmonary disease)  Tobacco abuse  Polysubstance abuse, as evidenced by urine drug screen, though patient denies.  Constipation start laxatives.  Patient's family was writing notes. All questions answered and they voiced appreciation.   LOS: 1 day   Arla Boutwell L 09/24/2012, 10:05 AM

## 2012-09-25 LAB — BASIC METABOLIC PANEL
CO2: 25 mEq/L (ref 19–32)
Calcium: 9.5 mg/dL (ref 8.4–10.5)
Creatinine, Ser: 2.25 mg/dL — ABNORMAL HIGH (ref 0.50–1.10)
GFR calc Af Amer: 32 mL/min — ABNORMAL LOW (ref >90)
GFR calc non Af Amer: 27 mL/min — ABNORMAL LOW (ref >90)
Sodium: 139 mEq/L (ref 135–145)

## 2012-09-25 LAB — ANTISTREPTOLYSIN O TITER: ASO: 164 IU/mL (ref <409)

## 2012-09-25 LAB — GLOMERULAR BASEMENT MEMBRANE ANTIBODIES: GBM Ab: <1 AU/mL (ref <20)

## 2012-09-25 LAB — CBC
MCV: 96.6 fL (ref 78.0–100.0)
Platelets: 242 10*3/uL (ref 150–400)
RBC: 3.48 MIL/uL — ABNORMAL LOW (ref 3.87–5.11)
RDW: 14.1 % (ref 11.5–15.5)
WBC: 7 10*3/uL (ref 4.0–10.5)

## 2012-09-25 LAB — HEPATITIS C ANTIBODY: HCV Ab: NEGATIVE

## 2012-09-25 LAB — MPO/PR-3 (ANCA) ANTIBODIES: Myeloperoxidase Abs: <1 AU/mL (ref <20)

## 2012-09-25 LAB — HEPATITIS B SURFACE ANTIGEN: Hepatitis B Surface Ag: NEGATIVE

## 2012-09-25 LAB — ANA: Anti Nuclear Antibody(ANA): NEGATIVE

## 2012-09-25 NOTE — Clinical Social Work Psychosocial (Signed)
    Clinical Social Work Department BRIEF PSYCHOSOCIAL ASSESSMENT 09/25/2012  Patient:  Nancy Lynn, Nancy Lynn     Account Number:  [id number]     Admit date:  09/23/2012  Clinical Social Worker:  Santa Genera, CLINICAL SOCIAL WORKER  Date/Time:  09/25/2012 02:00 PM  Referred by:  Physician  Date Referred:  09/25/2012 Referred for  Behavioral Health Issues   Other Referral:   Interview type:  Patient Other interview type:   Also spoke w family members in room w patient's consent    PSYCHOSOCIAL DATA Living Status:  FAMILY Admitted from facility:   Level of care:   Primary support name:  Rogene Houston Primary support relationship to patient:  SPOUSE Degree of support available:   Significant other.  Has limited family support from parents    CURRENT CONCERNS Current Concerns  Behavioral Health Issues   Other Concerns:    SOCIAL WORK ASSESSMENT / PLAN CSW met w patient at bedside, patient alert and oriented x4.  Patient states she is depressed and overwhelmed.  Has several children, says she has much difficulty parenting her 67 and 78 year olds, both of whom are disabled and receive SSI.  Patient says she has been to [employer] providers [facility], [facility]) seeking help for herself and her 34 year old son, but nothing has been effective.    Patient is disabled, receives SSI.  Says she feels depressed, hopeless, and overwhelmed "most of the time." Easily tearful.  [place], says she used to want to do activities w her children, but now "I just wish the day would pass."  Isolates herself in her room, says she has no friends, only her husband.  When asked to identify her support system, she names her husband and children.  CSW questioned the degree of support available from her children, and patient admitted that she cannot depend on her children for support as she would with an adult.    Patient wants referral to outpatient mental health counseling, but says she has  tried various agencies and has not been satisfied.  Would like out of town referral [facility]).    Patient becomes very tearful when stating that her depression began when she found her 58 day old infant dead of SIDS at home.  PAtient was 34 at the time.  Patient feels that she has not "gotten over" that loss.    Patient agreed to CSW contacting [facility] for a list of counselors available under her insurance.  Also wanted referral for chaplain for support.   Assessment/plan status:  Psychosocial Support/Ongoing Assessment of Needs Other assessment/ plan:   Information/referral to community resources:   Will be given referral to [facility] provider    PATIENT'S/FAMILY'S RESPONSE TO PLAN OF CARE: Appreciative.   Santa Genera, LCSW Clinical Social Worker [phone])

## 2012-09-25 NOTE — Progress Notes (Addendum)
PT TRANSFERING TO ROOM 210. PT IS ALERT AND ORIENTED. VVS. RT AC IV PATENT. PT HAS VOIDED SINCE FOLEY D/C'D. ONE OF PT'S SONS AT BEDSIDE. TRANSFER REPORT GIVEN TO LISA COVINGTON RN ON 200.

## 2012-09-25 NOTE — Progress Notes (Addendum)
IV fluids were inadvertently stopped at some point yesterday. Patient has ambulated without difficulty. I spoke to the patient alone.  Subjective: Wants to go home. Misses her family. Admits to buying pills off the street, including methadone, Tylox, marijuana. Denies having taken amphetamine or its derivatives. Reports family discord. Sadness since the death of her son years ago. Feels stressed all the time. Complains of knee pain. Unable to exercise and lose weight because of pain. Wants a cigarette.  Denies chest pain or shortness of breath.  Objective: Vital signs in last 24 hours: Filed Vitals:   09/25/12 0500 09/25/12 0600 09/25/12 0759 09/25/12 0837  BP: 94/47 87/44    Pulse:      Temp:    99.2 F (37.3 C)  TempSrc:    Oral  Resp: 22 20    Height:      Weight: 153.8 kg (339 lb 1.1 oz)     SpO2:  97% 98%    Weight change: 8.649 kg (19 lb 1.1 oz)  Intake/Output Summary (Last 24 hours) at 09/25/12 0842 Last data filed at 09/25/12 1610  Gross per 24 hour  Intake   1995 ml  Output   2700 ml  Net   -705 ml   General: Alert, oriented and comfortable, morbidly obese,  Lungs clear to auscultation bilaterally without wheeze rhonchi or rales Cardiovascular regular rate rhythm without murmurs gallops rubs Abdomen obese soft nontender Extremities no clubbing cyanosis or edema  Lab Results: Basic Metabolic Panel:  Lab 09/25/12 9604 09/24/12 0500  NA 139 134*  K 4.5 3.7  CL 108 101  CO2 25 22  GLUCOSE 96 104*  BUN 40* 56*  CREATININE 2.25* 4.07*  CALCIUM 9.5 9.0  MG -- --  PHOS -- 6.4*   Liver Function Tests:  Lab 09/24/12 0500  AST 13  ALT 11  ALKPHOS 73  BILITOT 0.1*  PROT 6.9  ALBUMIN 3.8   No results found for this basename: LIPASE:2,AMYLASE:2 in the last 168 hours No results found for this basename: AMMONIA:2 in the last 168 hours CBC:  Lab 09/25/12 0427 09/24/12 0500 09/23/12 0800  WBC 7.0 9.1 --  NEUTROABS -- -- 9.5*  HGB 10.9* 11.1* --  HCT 33.6*  33.5* --  MCV 96.6 94.9 --  PLT 242 256 --   Cardiac Enzymes:  Lab 09/23/12 0800  CKTOTAL --  CKMB --  CKMBINDEX --  TROPONINI <0.30   BNP: No results found for this basename: PROBNP:3 in the last 168 hours D-Dimer:  Lab 09/23/12 0800  DDIMER 0.78*   CBG: No results found for this basename: GLUCAP:6 in the last 168 hours Hemoglobin A1C: No results found for this basename: HGBA1C in the last 168 hours Fasting Lipid Panel: No results found for this basename: CHOL,HDL,LDLCALC,TRIG,CHOLHDL,LDLDIRECT in the last 540 hours Thyroid Function Tests: No results found for this basename: TSH,T4TOTAL,FREET4,T3FREE,THYROIDAB in the last 168 hours Coagulation: No results found for this basename: LABPROT:4,INR:4 in the last 168 hours Anemia Panel: No results found for this basename: VITAMINB12,FOLATE,FERRITIN,TIBC,IRON,RETICCTPCT in the last 168 hours Urine Drug Screen: Drugs of Abuse     Component Value Date/Time   LABOPIA POSITIVE* 09/23/2012 1151   COCAINSCRNUR NONE DETECTED 09/23/2012 1151   LABBENZ POSITIVE* 09/23/2012 1151   AMPHETMU POSITIVE* 09/23/2012 1151   THCU POSITIVE* 09/23/2012 1151   LABBARB NONE DETECTED 09/23/2012 1151    Alcohol Level: No results found for this basename: ETH:2 in the last 168 hours Urinalysis:  Lab 09/23/12 1557  COLORURINE YELLOW  LABSPEC 1.015  PHURINE 5.5  GLUCOSEU NEGATIVE  HGBUR MODERATE*  BILIRUBINUR NEGATIVE  KETONESUR NEGATIVE  PROTEINUR NEGATIVE  UROBILINOGEN 0.2  NITRITE NEGATIVE  LEUKOCYTESUR NEGATIVE   Micro Results: Recent Results (from the past 240 hour(s))  MRSA PCR SCREENING     Status: Normal   Collection Time   09/23/12  2:51 PM      Component Value Range Status Comment   MRSA by PCR NEGATIVE  NEGATIVE Final    Studies/Results: US Renal  09/23/2012  *RADIOLOGY REPORT*  Clinical Data: Acute renal failure.  RENAL/URINARY TRACT ULTRASOUND COMPLETE  Comparison:  CT abdomen and pelvis 11/04/2009.  Findings:  Right Kidney:   Measures 10.0 cm and appears normal without stone, mass or hydronephrosis.  Left Kidney:  Measures 11.6 cm and appears normal without stone, mass or hydronephrosis.  Bladder:  Unremarkable.  IMPRESSION: Negative for hydronephrosis.  Negative study.   Original Report Authenticated By: Holley Dexter, M.D.    US Venous Img Lower Bilateral  09/23/2012  *RADIOLOGY REPORT*  Clinical Data: Bilateral lower extremity swelling.  BILATERAL LOWER EXTREMITY VENOUS DUPLEX ULTRASOUND  Technique:  Gray-scale sonography with graded compression, as well as color Doppler and duplex ultrasound, were performed to evaluate the deep venous system of both lower extremities from the level of the common femoral vein through the popliteal and proximal calf veins.  Spectral Doppler was utilized to evaluate flow at rest and with distal augmentation maneuvers.  Comparison:  Ultrasound 07/22/2010.  Findings:  Normal compressibility of bilateral common femoral, superficial femoral, and popliteal veins is demonstrated, as well as the visualized proximal calf veins.  No filling defects to suggest DVT on grayscale or color Doppler imaging.  Doppler waveforms show normal direction of venous flow, normal respiratory phasicity and response to augmentation.  IMPRESSION: No evidence of deep vein thrombosis in either lower extremity.   Original Report Authenticated By: Holley Dexter, M.D.    Scheduled Meds:    . ALPRAZolam  1 mg Oral TID  . gabapentin  400 mg Oral TID  . heparin  5,000 Units Subcutaneous Q8H  . mometasone-formoterol  2 puff Inhalation BID  . nicotine  21 mg Transdermal Daily  . polyethylene glycol  17 g Oral BID  . sodium chloride  3 mL Intravenous Q12H  . [DISCONTINUED] mometasone-formoterol  2 puff Inhalation BID  . [DISCONTINUED] nicotine  14 mg Transdermal Daily   Continuous Infusions:    . sodium chloride Stopped (09/24/12 1300)   PRN Meds:.ondansetron (ZOFRAN) IV, ondansetron, oxyCODONE,  traMADol Assessment/Plan: Principal Problem:  *ARF (acute renal failure), likely secondary to medications and hypotension, improving Active Problems:  Hypotension continues despite being off medications. Patient is rather asymptomatic. Will however continue step down monitoring to  HTN (hypertension)  Chest pain at rest, likely musculoskeletal per  Obesity  COPD (chronic obstructive pulmonary disease)  Tobacco abuse  Polysubstance abuse: The patient now admits to this. She minimizes her use and makes many excuses. It's not clear that she is ready to quit, but hopefully this hospitalization will help her reprioritize her life. Will consult social work. She seems to be suffering from a lot of anxiety, family stressors, and grief. Counseling may help if she is willing.  Constipation: laxatives.  Transfer to the floor. Resume IV fluids. Hopefully home tomorrow.   LOS: 2 days   Regenia Erck L 09/25/2012, 8:42 AM

## 2012-09-25 NOTE — Clinical Social Work Note (Signed)
[facility] contacted for outpatient therapy resources for patient. Patient given three referrals:  [facility] [place], [place]), [facility] ([place]) and [facility] [place]).  Chaplain paged.  Santa Genera, LCSW Clinical Social Worker [phone])

## 2012-09-25 NOTE — Progress Notes (Signed)
Subjective: Interval History: has no complaint of nausea or vomiting. She denies also any difficulty in breathing..  Objective: Vital signs in last 24 hours: Temp:  [98.2 F (36.8 C)-98.9 F (37.2 C)] 98.8 F (37.1 C) (12/09 0414) Resp:  [12-23] 20  (12/09 0600) BP: (82-126)/(43-70) 87/44 mmHg (12/09 0600) SpO2:  [97 %] 97 % (12/09 0600) Weight:  [153.8 kg (339 lb 1.1 oz)] 153.8 kg (339 lb 1.1 oz) (12/09 0500) Weight change: 8.649 kg (19 lb 1.1 oz)  Intake/Output from previous day: 12/08 0701 - 12/09 0700 In: 1755 [P.O.:960; I.V.:795] Out: 2700 [Urine:2700] Intake/Output this shift:    General appearance: alert, cooperative and no distress Resp: clear to auscultation bilaterally Cardio: regular rate and rhythm, S1, S2 normal, no murmur, click, rub or gallop GI: soft, non-tender; bowel sounds normal; no masses,  no organomegaly Extremities: extremities normal, atraumatic, no cyanosis or edema  Lab Results:  Basename 09/25/12 0427 09/24/12 0500  WBC 7.0 9.1  HGB 10.9* 11.1*  HCT 33.6* 33.5*  PLT 242 256   BMET:  Basename 09/25/12 0427 09/24/12 0500  NA 139 134*  K 4.5 3.7  CL 108 101  CO2 25 22  GLUCOSE 96 104*  BUN 40* 56*  CREATININE 2.25* 4.07*  CALCIUM 9.5 9.0   No results found for this basename: PTH:2 in the last 72 hours Iron Studies: No results found for this basename: IRON,TIBC,TRANSFERRIN,FERRITIN in the last 72 hours  Studies/Results: Dg Chest 2 View  09/23/2012  *RADIOLOGY REPORT*  Clinical Data: Chest pain and wheezing.  CHEST - 2 VIEW  Comparison: 11/06/2011.  Findings: The cardiac silhouette, mediastinal and hilar contours are within normal limits and stable.  The lungs demonstrate mild bronchitic changes, likely related to smoking.  No infiltrates, edema or effusions.  IMPRESSION: Mild chronic bronchitic type lung changes, likely relates to smoking.  No definite infiltrates or effusions.   Original Report Authenticated By: Rudie Meyer, M.D.    US  Renal  09/23/2012  *RADIOLOGY REPORT*  Clinical Data: Acute renal failure.  RENAL/URINARY TRACT ULTRASOUND COMPLETE  Comparison:  CT abdomen and pelvis 11/04/2009.  Findings:  Right Kidney:  Measures 10.0 cm and appears normal without stone, mass or hydronephrosis.  Left Kidney:  Measures 11.6 cm and appears normal without stone, mass or hydronephrosis.  Bladder:  Unremarkable.  IMPRESSION: Negative for hydronephrosis.  Negative study.   Original Report Authenticated By: Holley Dexter, M.D.    US Venous Img Lower Bilateral  09/23/2012  *RADIOLOGY REPORT*  Clinical Data: Bilateral lower extremity swelling.  BILATERAL LOWER EXTREMITY VENOUS DUPLEX ULTRASOUND  Technique:  Gray-scale sonography with graded compression, as well as color Doppler and duplex ultrasound, were performed to evaluate the deep venous system of both lower extremities from the level of the common femoral vein through the popliteal and proximal calf veins.  Spectral Doppler was utilized to evaluate flow at rest and with distal augmentation maneuvers.  Comparison:  Ultrasound 07/22/2010.  Findings:  Normal compressibility of bilateral common femoral, superficial femoral, and popliteal veins is demonstrated, as well as the visualized proximal calf veins.  No filling defects to suggest DVT on grayscale or color Doppler imaging.  Doppler waveforms show normal direction of venous flow, normal respiratory phasicity and response to augmentation.  IMPRESSION: No evidence of deep vein thrombosis in either lower extremity.   Original Report Authenticated By: Holley Dexter, M.D.     I have reviewed the patient's current medications.  Assessment/Plan: Problem #1 acute kidney injury her BUN and  creatinine is progressively improving. She is none oliguric. Problem #2 chest pain patient seems to be feeling better. Problem #3 COPD Problem #4 obesity 1 Problem #5 anemia her hemoglobin and hematocrit is low. Most likely iron deficiency. Problem  #6 hypotension blood pressure remains low. Plan: We'll continue to hydrate patient and she is encouraged to drink loss of fluid We'll check iron studies and basic metabolic panel in the morning.   LOS: 2 days   Nancy Lynn S 09/25/2012,7:43 AM

## 2012-09-26 LAB — FERRITIN: Ferritin: 92 ng/mL (ref 10–291)

## 2012-09-26 LAB — BASIC METABOLIC PANEL
CO2: 25 mEq/L (ref 19–32)
Chloride: 108 mEq/L (ref 96–112)
Creatinine, Ser: 1.32 mg/dL — ABNORMAL HIGH (ref 0.50–1.10)
GFR calc Af Amer: 60 mL/min — ABNORMAL LOW (ref >90)
Potassium: 4.3 mEq/L (ref 3.5–5.1)
Sodium: 140 mEq/L (ref 135–145)

## 2012-09-26 LAB — IRON AND TIBC
Iron: 81 ug/dL (ref 42–135)
TIBC: 221 ug/dL — ABNORMAL LOW (ref 250–470)
UIBC: 140 ug/dL (ref 125–400)

## 2012-09-26 MED ORDER — ALPRAZOLAM 1 MG PO TABS: 1.0000 mg | ORAL_TABLET | Freq: Three times a day (TID) | ORAL | Status: DC | PRN

## 2012-09-26 MED ORDER — OXYCODONE HCL 5 MG PO TABS: 5.0000 mg | ORAL_TABLET | Freq: Three times a day (TID) | ORAL | Status: DC | PRN

## 2012-09-26 MED ORDER — POLYETHYLENE GLYCOL 3350 17 G PO PACK: 17.0000 g | PACK | Freq: Every day | ORAL | Status: DC | PRN

## 2012-09-26 NOTE — Progress Notes (Signed)
Patient discharged this am with instructions given on discharge medications,and follow up visits,patient verbalized understanding. Prescriptions sent with patient.No c/o pain or discomfort noted.Accompanied by staff to an awaiting vehicle.

## 2012-09-26 NOTE — Discharge Summary (Addendum)
Physician Discharge Summary  Patient ID: Nancy Lynn MRN: [id number] DOB/AGE: Jun 14, 1976 36 y.o.  Admit date: 09/23/2012 Discharge date: 09/26/2012  Discharge Diagnoses:  Principal Problem:  *ARF (acute renal failure) Active Problems:  Hypotension  HTN (hypertension)  Chest pain at rest  Obesity  COPD (chronic obstructive pulmonary disease)  Tobacco abuse  Polysubstance abuse  Constipation     Medication List     As of 09/26/2012 10:10 AM    STOP taking these medications         furosemide 20 MG tablet   Commonly known as: LASIX      ibuprofen 800 MG tablet   Commonly known as: ADVIL,MOTRIN      lisinopril 20 MG tablet   Commonly known as: PRINIVIL,ZESTRIL      TAKE these medications         ALPRAZolam 1 MG tablet   Commonly known as: XANAX   Take 1 tablet (1 mg total) by mouth 3 (three) times daily as needed (anxiety).      Fluticasone-Salmeterol 250-50 MCG/DOSE Aepb   Commonly known as: ADVAIR   Inhale 1 puff into the lungs every 12 (twelve) hours as needed. For shortness of breath      gabapentin 800 MG tablet   Commonly known as: NEURONTIN   Take 800 mg by mouth 3 (three) times daily as needed. For pain      oxyCODONE 5 MG immediate release tablet   Commonly known as: Oxy IR/ROXICODONE   Take 1 tablet (5 mg total) by mouth every 8 (eight) hours as needed.      polyethylene glycol packet   Commonly known as: MIRALAX / GLYCOLAX   Take 17 g by mouth daily as needed.      tiZANidine 2 MG tablet   Commonly known as: ZANAFLEX   Take 2 mg by mouth 3 (three) times daily.      traMADol 50 MG tablet   Commonly known as: ULTRAM   Take 100 mg by mouth 3 (three) times daily as needed. For pain      VENTOLIN HFA 108 (90 BASE) MCG/ACT inhaler   Generic drug: albuterol   Inhale 2 puffs into the lungs daily as needed. For shortness of breath            Discharge Orders    Future Orders Please Complete By Expires   Diet - low sodium heart healthy       Activity as tolerated - No restrictions         Follow-up Information    Follow up with Marval Regal, MD.   Contact information:   [address] [place] [address] [phone]          Disposition: 01-Home or Self Care  Discharged Condition: Stable  Consults: Treatment Team:  Harriett Sine, MD  Labs:    Sodium 131   134 139  140    Potassium 3.4   3.7 4.5   4.3    Chloride 94   101 108  108    CO2 22   22 25  25     BUN 60   56 40  20     Creatinine, Ser 6.01   4.07 2.25  1.32    Calcium 9.4   9.0 9.5  8.9    GFR calc non Af Amer 8   13 27   52    GFR calc Af Amer 10    15  32  60     Glucose, Bld 105   104 96  95    Phosphorus    6.4       Alkaline Phosphatase    73       Albumin    3.8       AST    13       ALT    11       Total Protein    6.9       Total Bilirubin    0.1        IRON /ANEMIA PROFILE    Iron       81    UIBC       140    TIBC       221    Saturation Ratios       37    Ferritin       92     CBC    WBC    9.1 7.0      RBC    3.53 3.48      Hemoglobin    11.1 10.9      HCT    33.5 33.6      MCV    94.9 96.6      MCH    31.4 31.3      MCHC    33.1 32.4      RDW    13.4 14.1      Platelets    256 242       DIABETES    Glucose, Bld 105   104 96  95     AUTOIMMUNE    ANA   NEGATIVE        ASO   164         GBM Ab   <1        Myeloperoxidase Abs   <1        Serine Protease 3   <1         COMPLEMENT    C3 Complement   164        Compl, Total (CH50)   60 U/mL">60        Complement C4, Body Fluid   46         HEPATITIS B    Hepatitis B Surface Ag   NEGATIVE         HEPATITIS C    HCV Ab   NEGATIVE         URINALYSIS    Color, Urine  YELLOW         APPearance  CLEAR         Specific Gravity, Urine  1.015         pH  5.5         Glucose, UA  NEGATIVE         Bilirubin Urine  NEGATIVE         Ketones, ur  NEGATIVE         Protein, ur  NEGATIVE         Urobilinogen, UA  0.2         Nitrite   NEGATIVE         Leukocytes, UA  NEGATIVE         Hgb urine dipstick  MODERATE         WBC, UA  0-2         Squamous Epithelial / LPF  FEW  Bacteria, UA  FEW          URINE CHEMISTRY    Sodium, Ur  30         Creatinine, Urine  172.71          CREATINE 24 HR    Creatinine, Urine  172.71          CREATININE 24 HR    Creatinine, Urine  172.71          SODIUM 24 HR    Sodium, Ur  30   Amphetamines POSITIVE Barbiturates NONE DETECTED  Benzodiazepines POSITIVE Opiates POSITIVE Cocaine NONE DETECTED Tetrahydrocannabinol POSITIVE   Diagnostics:  Dg Chest 2 View  09/23/2012  *RADIOLOGY REPORT*  Clinical Data: Chest pain and wheezing.  CHEST - 2 VIEW  Comparison: 11/06/2011.  Findings: The cardiac silhouette, mediastinal and hilar contours are within normal limits and stable.  The lungs demonstrate mild bronchitic changes, likely related to smoking.  No infiltrates, edema or effusions.  IMPRESSION: Mild chronic bronchitic type lung changes, likely relates to smoking.  No definite infiltrates or effusions.   Original Report Authenticated By: Rudie Meyer, M.D.    US Renal  09/23/2012  *RADIOLOGY REPORT*  Clinical Data: Acute renal failure.  RENAL/URINARY TRACT ULTRASOUND COMPLETE  Comparison:  CT abdomen and pelvis 11/04/2009.  Findings:  Right Kidney:  Measures 10.0 cm and appears normal without stone, mass or hydronephrosis.  Left Kidney:  Measures 11.6 cm and appears normal without stone, mass or hydronephrosis.  Bladder:  Unremarkable.  IMPRESSION: Negative for hydronephrosis.  Negative study.   Original Report Authenticated By: Holley Dexter, M.D.    US Venous Img Lower Bilateral  09/23/2012  *RADIOLOGY REPORT*  Clinical Data: Bilateral lower extremity swelling.  BILATERAL LOWER EXTREMITY VENOUS DUPLEX ULTRASOUND  Technique:  Gray-scale sonography with graded compression, as well as color Doppler and duplex ultrasound, were performed to evaluate the deep venous system of both  lower extremities from the level of the common femoral vein through the popliteal and proximal calf veins.  Spectral Doppler was utilized to evaluate flow at rest and with distal augmentation maneuvers.  Comparison:  Ultrasound 07/22/2010.  Findings:  Normal compressibility of bilateral common femoral, superficial femoral, and popliteal veins is demonstrated, as well as the visualized proximal calf veins.  No filling defects to suggest DVT on grayscale or color Doppler imaging.  Doppler waveforms show normal direction of venous flow, normal respiratory phasicity and response to augmentation.  IMPRESSION: No evidence of deep vein thrombosis in either lower extremity.   Original Report Authenticated By: Holley Dexter, M.D.    EKG: sinus tachycardia  Full Code   Hospital Course: See H&P for complete admission details. The patient is a 36 year old white female with history of polysubstance abuse who presented with sudden onset chest pain. She had been taking 800 mg of ibuprofen 4 times daily. Blood pressure initially in the emergency room was 95/56. Heart rate 100. Weight was 320 pounds. She had reproducible chest wall pain and appeared anxious. BUN was found to be 61. Creatinine 6.83. Patient was admitted to step down. Her antihypertensives were held. She was given IV fluids. Nephrology was consulted. Patient's urine drug screen showed multiple substances. Patient admitted to buying pills on the street, including methadone, Tylox. She admitted to smoking marijuana, but denied amphetamine use. Her hypotension and renal failure were likely related to the antihypertensives, and says, multiple substance use. She ruled out for MI. Doppler of the legs are negative. It was felt unlikely  that she had a PE, given the fact that her chest pain was reproducible. Patient's blood pressure remained borderline low but she was relatively asymptomatic, able to ambulate in the hallways. By the time of discharge, her creatinine  was nearly at baseline. Social work was consulted for substance abuse and family stressors.  Total time on the day of discharge greater than 30 minutes.  Discharge Exam:  Blood pressure 103/64, pulse 84, temperature 98.5 F (36.9 C), temperature source Oral, resp. rate 16, height 5\' 7"  (1.702 m), weight 153.8 kg (339 lb 1.1 oz), SpO2 100.00%.  General: Alert, oriented. Talking on the phone heatedly. Lungs clear to auscultation bilaterally without wheezes rhonchi or rales Cardiovascular regular rate rhythm without murmurs gallops rubs Abdomen soft nontender nondistended Extremities no clubbing cyanosis or edema  Signed: Alois Mincer L 09/26/2012, 10:10 AM

## 2012-09-26 NOTE — Progress Notes (Signed)
Subjective: Interval History: has no complaint of nausea or vomiting. She has still some back pain seems to be better since she is on pain medication. She denies any difficulty breathing..  Objective: Vital signs in last 24 hours: Temp:  [98.4 F (36.9 C)-99.4 F (37.4 C)] 98.5 F (36.9 C) (12/10 0411) Pulse Rate:  [80-92] 84  (12/10 0411) Resp:  [16] 16  (12/10 0411) BP: (100-138)/(58-81) 103/64 mmHg (12/10 0411) SpO2:  [98 %-100 %] 100 % (12/10 0948) Weight change:   Intake/Output from previous day: 12/09 0701 - 12/10 0700 In: 4279.6 [P.O.:720; I.V.:3559.6] Out: 1700 [Urine:1700] Intake/Output this shift: Total I/O In: -  Out: 800 [Urine:800]  Generally she is alert no apparent distress Chest decreased breath sound otherwise seems to be clear she'll have any rales rhonchi or egophony Heart exam regular rate and rhythm Extremities trace edema.  Lab Results:  Basename 09/25/12 0427 09/24/12 0500  WBC 7.0 9.1  HGB 10.9* 11.1*  HCT 33.6* 33.5*  PLT 242 256   BMET:  Basename 09/26/12 0534 09/25/12 0427  NA 140 139  K 4.3 4.5  CL 108 108  CO2 25 25  GLUCOSE 95 96  BUN 20 40*  CREATININE 1.32* 2.25*  CALCIUM 8.9 9.5   No results found for this basename: PTH:2 in the last 72 hours Iron Studies: No results found for this basename: IRON,TIBC,TRANSFERRIN,FERRITIN in the last 72 hours  Studies/Results: No results found.  I have reviewed the patient's current medications.  Assessment/Plan: Problem #1 acute kidney injury at this moment seems to be multifactorial pedis 20 creatinine is 1.3 to renal function has improved. She is none oliguric. Problem #2 anemia her hemoglobin is 10.9 at with history 0.6 stable Problem #3 history of COPD Problem #4 history of obesity Problem #5 history of polysubstance abuse  Problem #6 hypertension her blood pressure has improved Problem #7 history of chronic back pain and pain medication. Plan: We'll DC IV fluid Patient advised not  to take nonsteroidal and also avoid illicit drug use. Patient advised also to increase her fluid intake.   LOS: 3 days   Shaquille Murdy S 09/26/2012,9:58 AM

## 2012-11-05 ENCOUNTER — Encounter (HOSPITAL_COMMUNITY): Payer: Self-pay | Admitting: *Deleted

## 2012-11-05 ENCOUNTER — Emergency Department (HOSPITAL_COMMUNITY)
Admission: EM | Admit: 2012-11-05 | Discharge: 2012-11-05 | Disposition: A | Discharge status: Home / Self Care | Payer: Medicaid Other | Attending: Emergency Medicine | Admitting: Emergency Medicine

## 2012-11-05 DIAGNOSIS — Z90710 Acquired absence of both cervix and uterus: Secondary | ICD-10-CM | POA: Insufficient documentation

## 2012-11-05 DIAGNOSIS — R1012 Left upper quadrant pain: Secondary | ICD-10-CM | POA: Insufficient documentation

## 2012-11-05 DIAGNOSIS — J449 Chronic obstructive pulmonary disease, unspecified: Secondary | ICD-10-CM | POA: Insufficient documentation

## 2012-11-05 DIAGNOSIS — M549 Dorsalgia, unspecified: Secondary | ICD-10-CM | POA: Insufficient documentation

## 2012-11-05 DIAGNOSIS — Z9889 Other specified postprocedural states: Secondary | ICD-10-CM | POA: Insufficient documentation

## 2012-11-05 DIAGNOSIS — J4489 Other specified chronic obstructive pulmonary disease: Secondary | ICD-10-CM | POA: Insufficient documentation

## 2012-11-05 DIAGNOSIS — Z79899 Other long term (current) drug therapy: Secondary | ICD-10-CM | POA: Insufficient documentation

## 2012-11-05 DIAGNOSIS — F17200 Nicotine dependence, unspecified, uncomplicated: Secondary | ICD-10-CM | POA: Insufficient documentation

## 2012-11-05 DIAGNOSIS — R109 Unspecified abdominal pain: Secondary | ICD-10-CM

## 2012-11-05 DIAGNOSIS — R609 Edema, unspecified: Secondary | ICD-10-CM | POA: Insufficient documentation

## 2012-11-05 DIAGNOSIS — IMO0002 Reserved for concepts with insufficient information to code with codable children: Secondary | ICD-10-CM | POA: Insufficient documentation

## 2012-11-05 LAB — BASIC METABOLIC PANEL
CO2: 28 mEq/L (ref 19–32)
Chloride: 102 mEq/L (ref 96–112)
Creatinine, Ser: 1.03 mg/dL (ref 0.50–1.10)
Potassium: 3.5 mEq/L (ref 3.5–5.1)
Sodium: 137 mEq/L (ref 135–145)

## 2012-11-05 LAB — URINALYSIS, ROUTINE W REFLEX MICROSCOPIC
Glucose, UA: NEGATIVE mg/dL
Leukocytes, UA: NEGATIVE
Protein, ur: NEGATIVE mg/dL
pH: 6.5 (ref 5.0–8.0)

## 2012-11-05 LAB — CBC WITH DIFFERENTIAL/PLATELET
Basophils Absolute: 0 10*3/uL (ref 0.0–0.1)
HCT: 38.7 % (ref 36.0–46.0)
Hemoglobin: 12.8 g/dL (ref 12.0–15.0)
Lymphocytes Relative: 38 % (ref 12–46)
Monocytes Absolute: 0.6 10*3/uL (ref 0.1–1.0)
Neutro Abs: 3.8 10*3/uL (ref 1.7–7.7)
Neutrophils Relative %: 51 % (ref 43–77)
RDW: 12.9 % (ref 11.5–15.5)
WBC: 7.5 10*3/uL (ref 4.0–10.5)

## 2012-11-05 MED ORDER — ONDANSETRON HCL 8 MG PO TABS: 8.0000 mg | ORAL_TABLET | Freq: Three times a day (TID) | ORAL | Status: DC | PRN

## 2012-11-05 MED ORDER — ONDANSETRON 8 MG PO TBDP
8.0000 mg | ORAL_TABLET | Freq: Once | ORAL | Status: AC
  Administered 2012-11-05: 8 mg via ORAL
  Filled 2012-11-05: qty 1

## 2012-11-05 MED ORDER — OXYCODONE-ACETAMINOPHEN 5-325 MG PO TABS: 1.0000 | ORAL_TABLET | ORAL | Status: DC | PRN

## 2012-11-05 MED ORDER — OXYCODONE-ACETAMINOPHEN 5-325 MG PO TABS
2.0000 | ORAL_TABLET | Freq: Once | ORAL | Status: AC
  Administered 2012-11-05: 2 via ORAL
  Filled 2012-11-05: qty 2

## 2012-11-05 NOTE — ED Notes (Signed)
Right flank pain radiating to right side back x 2 days.  Also c/o left leg pain.   Ambulatory to triage without difficulty.

## 2012-11-05 NOTE — ED Provider Notes (Signed)
History  This chart was scribed for Joya Gaskins, MD by Erskine Emery, ED Scribe. This patient was seen in room APA03/[facility]03 and the patient's care was started at 12:51.   CSN: [id number]  Arrival date & time 11/05/12  1045   First MD Initiated Contact with Patient 11/05/12 1251      Chief Complaint  Patient presents with  . Flank Pain   The history is provided by the patient. No language interpreter was used.  Nancy Lynn is a 37 y.o. female who presents to the Emergency Department complaining of right flank pain that radiates to the back and left side since Friday (3 days ago). Pt reports some associated leg swelling (so bad it is difficulty for her to walk) but she denies any dysuria. Pt reports a h/o similar symptoms associated with gall bladder issues. She has a h/o COPD, HTN, gallstones, 4 caesarian sections, tubal ligation, and an abdominal hysterectomy.  Pt has no PCP. Nothing improves her symptoms It seems that eating worsens her symptoms No cp/sob.  No focal weakness.   Past Medical History  Diagnosis Date  . COPD (chronic obstructive pulmonary disease)   . Hypertension   . Gall stones   . Renal disorder     kidney failure    Past Surgical History  Procedure Date  . Cesarean section   . Abdominal hysterectomy   . Tubal ligation     Family History  Problem Relation Age of Onset  . Diabetes Mother   . Hypertension Father     History  Substance Use Topics  . Smoking status: Current Every Day Smoker -- 1.0 packs/day    Types: Cigarettes  . Smokeless tobacco: Not on file  . Alcohol Use: No    OB History    Grav Para Term Preterm Abortions TAB SAB Ect Mult Living                  Review of Systems  Constitutional: Negative for fever.  Respiratory: Negative for shortness of breath.   Cardiovascular: Negative for chest pain.  Gastrointestinal: Negative for diarrhea.  Genitourinary: Positive for flank pain. Negative for dysuria.  Musculoskeletal:  Positive for back pain.       Leg swelling  Neurological: Negative for weakness.  Psychiatric/Behavioral: Negative for agitation.  All other systems reviewed and are negative.    Allergies  Latex  Home Medications   Current Outpatient Rx  Name  Route  Sig  Dispense  Refill  . ALBUTEROL SULFATE HFA 108 (90 BASE) MCG/ACT IN AERS   Inhalation   Inhale 2 puffs into the lungs daily as needed. For shortness of breath         . ALPRAZOLAM 1 MG PO TABS   Oral   Take 1 tablet (1 mg total) by mouth 3 (three) times daily as needed (anxiety).   30 tablet   0   . FLUTICASONE-SALMETEROL 250-50 MCG/DOSE IN AEPB   Inhalation   Inhale 1 puff into the lungs every 12 (twelve) hours as needed. For shortness of breath         . GABAPENTIN 800 MG PO TABS   Oral   Take 800 mg by mouth 3 (three) times daily as needed. For pain         . OXYCODONE HCL 5 MG PO TABS   Oral   Take 1 tablet (5 mg total) by mouth every 8 (eight) hours as needed.   20 tablet   0   .  POLYETHYLENE GLYCOL 3350 PO PACK   Oral   Take 17 g by mouth daily as needed.   14 each      . TIZANIDINE HCL 2 MG PO TABS   Oral   Take 2 mg by mouth 3 (three) times daily.         . TRAMADOL HCL 50 MG PO TABS   Oral   Take 100 mg by mouth 3 (three) times daily as needed. For pain           Triage Vitals: BP 132/65  Pulse 105  Temp 98.1 F (36.7 C) (Oral)  Resp 20  Ht 5\' 7"  (1.702 m)  Wt 310 lb (140.615 kg)  BMI 48.55 kg/m2  SpO2 100%  Physical Exam CONSTITUTIONAL: Well developed/well nourished HEAD AND FACE: Normocephalic/atraumatic EYES: EOMI/PERRL, no icterus ENMT: Mucous membranes moist NECK: supple no meningeal signs SPINE:entire spine nontender CV: S1/S2 noted, no murmurs/rubs/gallops noted LUNGS: Lungs are clear to auscultation bilaterally, no apparent distress ABDOMEN: soft, nontender, no rebound or guarding GU:no cva tenderness NEURO: Pt is awake/alert, moves all extremitiesx4, no focal  motor deficits noted EXTREMITIES: pulses normal, full ROM.  No LE edema noted.   SKIN: warm, color normal PSYCH: no abnormalities of mood noted   ED Course  Procedures  DIAGNOSTIC STUDIES: Oxygen Saturation is 100% on room air, normal by my interpretation.    COORDINATION OF CARE: 13:25--I evaluated the patient and we discussed a treatment plan including pain medicine to which the pt agreed.  Results for orders placed during the hospital encounter of 11/05/12  URINALYSIS, ROUTINE W REFLEX MICROSCOPIC      Component Value Range   Color, Urine YELLOW  YELLOW   APPearance CLEAR  CLEAR   Specific Gravity, Urine 1.015  1.005 - 1.030   pH 6.5  5.0 - 8.0   Glucose, UA NEGATIVE  NEGATIVE mg/dL   Hgb urine dipstick NEGATIVE  NEGATIVE   Bilirubin Urine NEGATIVE  NEGATIVE   Ketones, ur NEGATIVE  NEGATIVE mg/dL   Protein, ur NEGATIVE  NEGATIVE mg/dL   Urobilinogen, UA 0.2  0.0 - 1.0 mg/dL   Nitrite NEGATIVE  NEGATIVE   Leukocytes, UA NEGATIVE  NEGATIVE  CBC WITH DIFFERENTIAL      Component Value Range   WBC 7.5  4.0 - 10.5 K/uL   RBC 4.05  3.87 - 5.11 MIL/uL   Hemoglobin 12.8  12.0 - 15.0 g/dL   HCT 56.2  13.0 - 86.5 %   MCV 95.6  78.0 - 100.0 fL   MCH 31.6  26.0 - 34.0 pg   MCHC 33.1  30.0 - 36.0 g/dL   RDW 78.4  69.6 - 29.5 %   Platelets 264  150 - 400 K/uL   Neutrophils Relative 51  43 - 77 %   Neutro Abs 3.8  1.7 - 7.7 K/uL   Lymphocytes Relative 38  12 - 46 %   Lymphs Abs 2.8  0.7 - 4.0 K/uL   Monocytes Relative 8  3 - 12 %   Monocytes Absolute 0.6  0.1 - 1.0 K/uL   Eosinophils Relative 2  0 - 5 %   Eosinophils Absolute 0.2  0.0 - 0.7 K/uL   Basophils Relative 0  0 - 1 %   Basophils Absolute 0.0  0.0 - 0.1 K/uL  BASIC METABOLIC PANEL      Component Value Range   Sodium 137  135 - 145 mEq/L   Potassium 3.5  3.5 - 5.1 mEq/L  Chloride 102  96 - 112 mEq/L   CO2 28  19 - 32 mEq/L   Glucose, Bld 103 (*) 70 - 99 mg/dL   BUN 10  6 - 23 mg/dL   Creatinine, Ser 1.61  0.50 -  1.10 mg/dL   Calcium 9.1  8.4 - 09.6 mg/dL   GFR calc non Af Amer 70 (*) >90 mL/min   GFR calc Af Amer 81 (*) >90 mL/min   Pt well appearing.  Her abdominal exam is benign.  Her labs are reassuring.  She reports she feels improved.  She reports most of her pain is in RUQ similar to prior biliary colic, but now improved.  Presentation does not appear c/w acute cholecystitis and referred back to surgery as outpatient.  No other signs of acute abdominal process   MDM  Nursing notes including past medical history and social history reviewed and considered in documentation Labs/vital reviewed and considered Previous records reviewed and considered - h/o gallstones and had previously cancelled cholecystectomy       I personally performed the services described in this documentation, which was scribed in my presence. The recorded information has been reviewed and is accurate.      Joya Gaskins, MD 11/05/12 [phone]

## 2012-12-30 DIAGNOSIS — F17200 Nicotine dependence, unspecified, uncomplicated: Secondary | ICD-10-CM | POA: Insufficient documentation

## 2012-12-30 DIAGNOSIS — Z87448 Personal history of other diseases of urinary system: Secondary | ICD-10-CM | POA: Insufficient documentation

## 2012-12-30 DIAGNOSIS — J449 Chronic obstructive pulmonary disease, unspecified: Secondary | ICD-10-CM | POA: Insufficient documentation

## 2012-12-30 DIAGNOSIS — I10 Essential (primary) hypertension: Secondary | ICD-10-CM | POA: Insufficient documentation

## 2012-12-30 DIAGNOSIS — S90569A Insect bite (nonvenomous), unspecified ankle, initial encounter: Secondary | ICD-10-CM | POA: Insufficient documentation

## 2012-12-30 DIAGNOSIS — R110 Nausea: Secondary | ICD-10-CM | POA: Insufficient documentation

## 2012-12-30 DIAGNOSIS — R21 Rash and other nonspecific skin eruption: Secondary | ICD-10-CM | POA: Insufficient documentation

## 2012-12-30 DIAGNOSIS — J4489 Other specified chronic obstructive pulmonary disease: Secondary | ICD-10-CM | POA: Insufficient documentation

## 2012-12-30 DIAGNOSIS — Y929 Unspecified place or not applicable: Secondary | ICD-10-CM | POA: Insufficient documentation

## 2012-12-30 DIAGNOSIS — Y939 Activity, unspecified: Secondary | ICD-10-CM | POA: Insufficient documentation

## 2012-12-30 DIAGNOSIS — Z79899 Other long term (current) drug therapy: Secondary | ICD-10-CM | POA: Insufficient documentation

## 2012-12-31 ENCOUNTER — Encounter (HOSPITAL_COMMUNITY): Payer: Self-pay | Admitting: *Deleted

## 2012-12-31 ENCOUNTER — Emergency Department (HOSPITAL_COMMUNITY)
Admission: EM | Admit: 2012-12-31 | Discharge: 2012-12-31 | Disposition: A | Discharge status: Home / Self Care | Payer: Medicaid Other | Attending: Emergency Medicine | Admitting: Emergency Medicine

## 2012-12-31 DIAGNOSIS — S80861A Insect bite (nonvenomous), right lower leg, initial encounter: Secondary | ICD-10-CM

## 2012-12-31 MED ORDER — OXYCODONE-ACETAMINOPHEN 5-325 MG PO TABS
1.0000 | ORAL_TABLET | Freq: Once | ORAL | Status: AC
  Administered 2012-12-31: 1 via ORAL
  Filled 2012-12-31: qty 1

## 2012-12-31 MED ORDER — SULFAMETHOXAZOLE-TMP DS 800-160 MG PO TABS
1.0000 | ORAL_TABLET | Freq: Once | ORAL | Status: AC
  Administered 2012-12-31: 1 via ORAL
  Filled 2012-12-31: qty 1

## 2012-12-31 MED ORDER — TRIAMCINOLONE ACETONIDE 0.1 % EX CREA: TOPICAL_CREAM | Freq: Two times a day (BID) | CUTANEOUS | Status: DC

## 2012-12-31 MED ORDER — ONDANSETRON 8 MG PO TBDP
8.0000 mg | ORAL_TABLET | Freq: Once | ORAL | Status: AC
  Administered 2012-12-31: 8 mg via ORAL
  Filled 2012-12-31: qty 1

## 2012-12-31 MED ORDER — DOXYCYCLINE HYCLATE 100 MG PO CAPS: 100.0000 mg | ORAL_CAPSULE | Freq: Two times a day (BID) | ORAL | Status: DC

## 2012-12-31 NOTE — ED Notes (Signed)
Pt co 2 quarter sized swollen, red, warm spots on top of lower rt leg, started today

## 2012-12-31 NOTE — ED Notes (Signed)
Discharge instructions reviewed with pt, questions answered. Pt verbalized understanding.  

## 2012-12-31 NOTE — ED Provider Notes (Signed)
Medical screening examination/treatment/procedure(s) were performed by non-physician practitioner and as supervising physician I was immediately available for consultation/collaboration.  Nicoletta Dress. Colon Branch, MD 12/31/12 [phone]

## 2012-12-31 NOTE — ED Provider Notes (Signed)
History     CSN: [id number]  Arrival date & time 12/30/12  2349   First MD Initiated Contact with Patient 12/31/12 0009      Chief Complaint  Patient presents with  . Rash    lower rt leg 2 swollen red spots    (Consider location/radiation/quality/duration/timing/severity/associated sxs/prior treatment) HPI Comments: Patient c/o two red "spots" to her right lower leg that she noticed earlier today.  States she is unsure if something may have bitten her.  States the areas are itchy, but painful to the touch.  She denies hx of similar rashes or abscesses.    Patient is a 37 y.o. female presenting with rash. The history is provided by the patient.  Rash Location:  Leg Leg rash location:  R lower leg Quality: itchiness, redness and swelling   Severity:  Mild Onset quality:  Gradual Duration:  12 hours Timing:  Constant Progression:  Unchanged Chronicity:  New Context: not exposure to similar rash, not new detergent/soap and not plant contact   Relieved by:  Nothing Worsened by:  Nothing tried Ineffective treatments:  None tried Associated symptoms: nausea   Associated symptoms: no fever, no headaches, no joint pain, no myalgias, no shortness of breath, no sore throat, no throat swelling, no URI, not vomiting and not wheezing     Past Medical History  Diagnosis Date  . COPD (chronic obstructive pulmonary disease)   . Hypertension   . Gall stones   . Renal disorder     kidney failure    Past Surgical History  Procedure Laterality Date  . Cesarean section    . Abdominal hysterectomy    . Tubal ligation      Family History  Problem Relation Age of Onset  . Diabetes Mother   . Hypertension Father     History  Substance Use Topics  . Smoking status: Current Every Day Smoker -- 1.00 packs/day    Types: Cigarettes  . Smokeless tobacco: Not on file  . Alcohol Use: No    OB History   Grav Para Term Preterm Abortions TAB SAB Ect Mult Living                   Review of Systems  Constitutional: Negative for fever, chills, activity change and appetite change.  HENT: Negative for sore throat, facial swelling, trouble swallowing, neck pain and neck stiffness.   Respiratory: Negative for chest tightness, shortness of breath and wheezing.   Gastrointestinal: Positive for nausea. Negative for vomiting.  Musculoskeletal: Negative for myalgias, back pain, joint swelling, arthralgias and gait problem.  Skin: Positive for rash. Negative for wound.  Neurological: Negative for dizziness, weakness, numbness and headaches.  All other systems reviewed and are negative.    Allergies  Latex  Home Medications   Current Outpatient Rx  Name  Route  Sig  Dispense  Refill  . albuterol (VENTOLIN HFA) 108 (90 BASE) MCG/ACT inhaler   Inhalation   Inhale 2 puffs into the lungs daily as needed. For shortness of breath         . ALPRAZolam (XANAX) 1 MG tablet   Oral   Take 1 tablet (1 mg total) by mouth 3 (three) times daily as needed (anxiety).   30 tablet   0   . buprenorphine-naloxone (SUBOXONE) 8-2 MG SUBL   Sublingual   Place 1 tablet under the tongue 3 (three) times daily.         . Fluticasone-Salmeterol (ADVAIR) 250-50 MCG/DOSE AEPB  Inhalation   Inhale 1 puff into the lungs every 12 (twelve) hours as needed. For shortness of breath         . furosemide (LASIX) 20 MG tablet   Oral   Take 20 mg by mouth 2 (two) times daily.         Marland Kitchen gabapentin (NEURONTIN) 800 MG tablet   Oral   Take 800 mg by mouth 3 (three) times daily as needed. For pain         . ibuprofen (ADVIL,MOTRIN) 200 MG tablet   Oral   Take 200 mg by mouth every 6 (six) hours as needed. Cramps.         . ondansetron (ZOFRAN) 8 MG tablet   Oral   Take 1 tablet (8 mg total) by mouth every 8 (eight) hours as needed for nausea.   10 tablet   0   . traMADol (ULTRAM) 50 MG tablet   Oral   Take 100 mg by mouth 3 (three) times daily as needed. For pain          . lisinopril (PRINIVIL,ZESTRIL) 20 MG tablet   Oral   Take 20 mg by mouth daily.         Marland Kitchen oxyCODONE (OXY IR/ROXICODONE) 5 MG immediate release tablet   Oral   Take 1 tablet (5 mg total) by mouth every 8 (eight) hours as needed.   20 tablet   0   . oxyCODONE-acetaminophen (PERCOCET/ROXICET) 5-325 MG per tablet   Oral   Take 1 tablet by mouth every 4 (four) hours as needed for pain.   5 tablet   0     BP 141/82  Pulse 99  Temp(Src) 99.2 F (37.3 C) (Oral)  Ht 5\' 7"  (1.702 m)  Wt 320 lb (145.151 kg)  BMI 50.11 kg/m2  SpO2 99%  Physical Exam  Nursing note and vitals reviewed. Constitutional: She is oriented to person, place, and time. She appears well-developed and well-nourished. No distress.  HENT:  Head: Normocephalic and atraumatic.  Mouth/Throat: Oropharynx is clear and moist.  Neck: Normal range of motion. Neck supple.  Cardiovascular: Normal rate, regular rhythm and normal heart sounds.   Pulmonary/Chest: Effort normal and breath sounds normal.  Musculoskeletal: She exhibits no edema and no tenderness.  Lymphadenopathy:    She has no cervical adenopathy.  Neurological: She is alert and oriented to person, place, and time. She exhibits normal muscle tone. Coordination normal.  Skin: Rash noted. There is erythema.  Two quarter sized erythematous macules to the right lower leg, mild induration.  No vesicles, fluctuance or obvious abscess.  Distal sensation intact, DP pulse brisk.    ED Course  Procedures (including critical care time)  Labs Reviewed - No data to display No results found.      MDM    Patient with two quarter sized erythematous macules to the right lower leg.  Slight induration present w/o fluctuance , vesicles or drainage.  Possible insect bites but early abscess also possible.  Patient reports itching , so I will treat with triamcinolone cream and bactrim DS.  She agrees to f/u with her PMD or return here if the sx's worsen.    The  patient appears reasonably screened and/or stabilized for discharge and I doubt any other medical condition or other [facility] requiring further screening, evaluation, or treatment in the ED at this time prior to discharge.      Deloise Marchant L. Jlon Betker, PA-C 12/31/12 0045

## 2012-12-31 NOTE — ED Notes (Signed)
Pt's ride at bedside during dc

## 2013-07-03 IMAGING — CR DG CHEST 2V
2 series · 2 of 2 positions shown · non-contrast
Comparison: PA and lateral chest 06/15/2007 and single view of the
chest 07/22/2010.

CLINICAL DATA: Cough and congestion.  History of COPD.

CHEST - 2 VIEW

[view not recorded (1 of 2)]
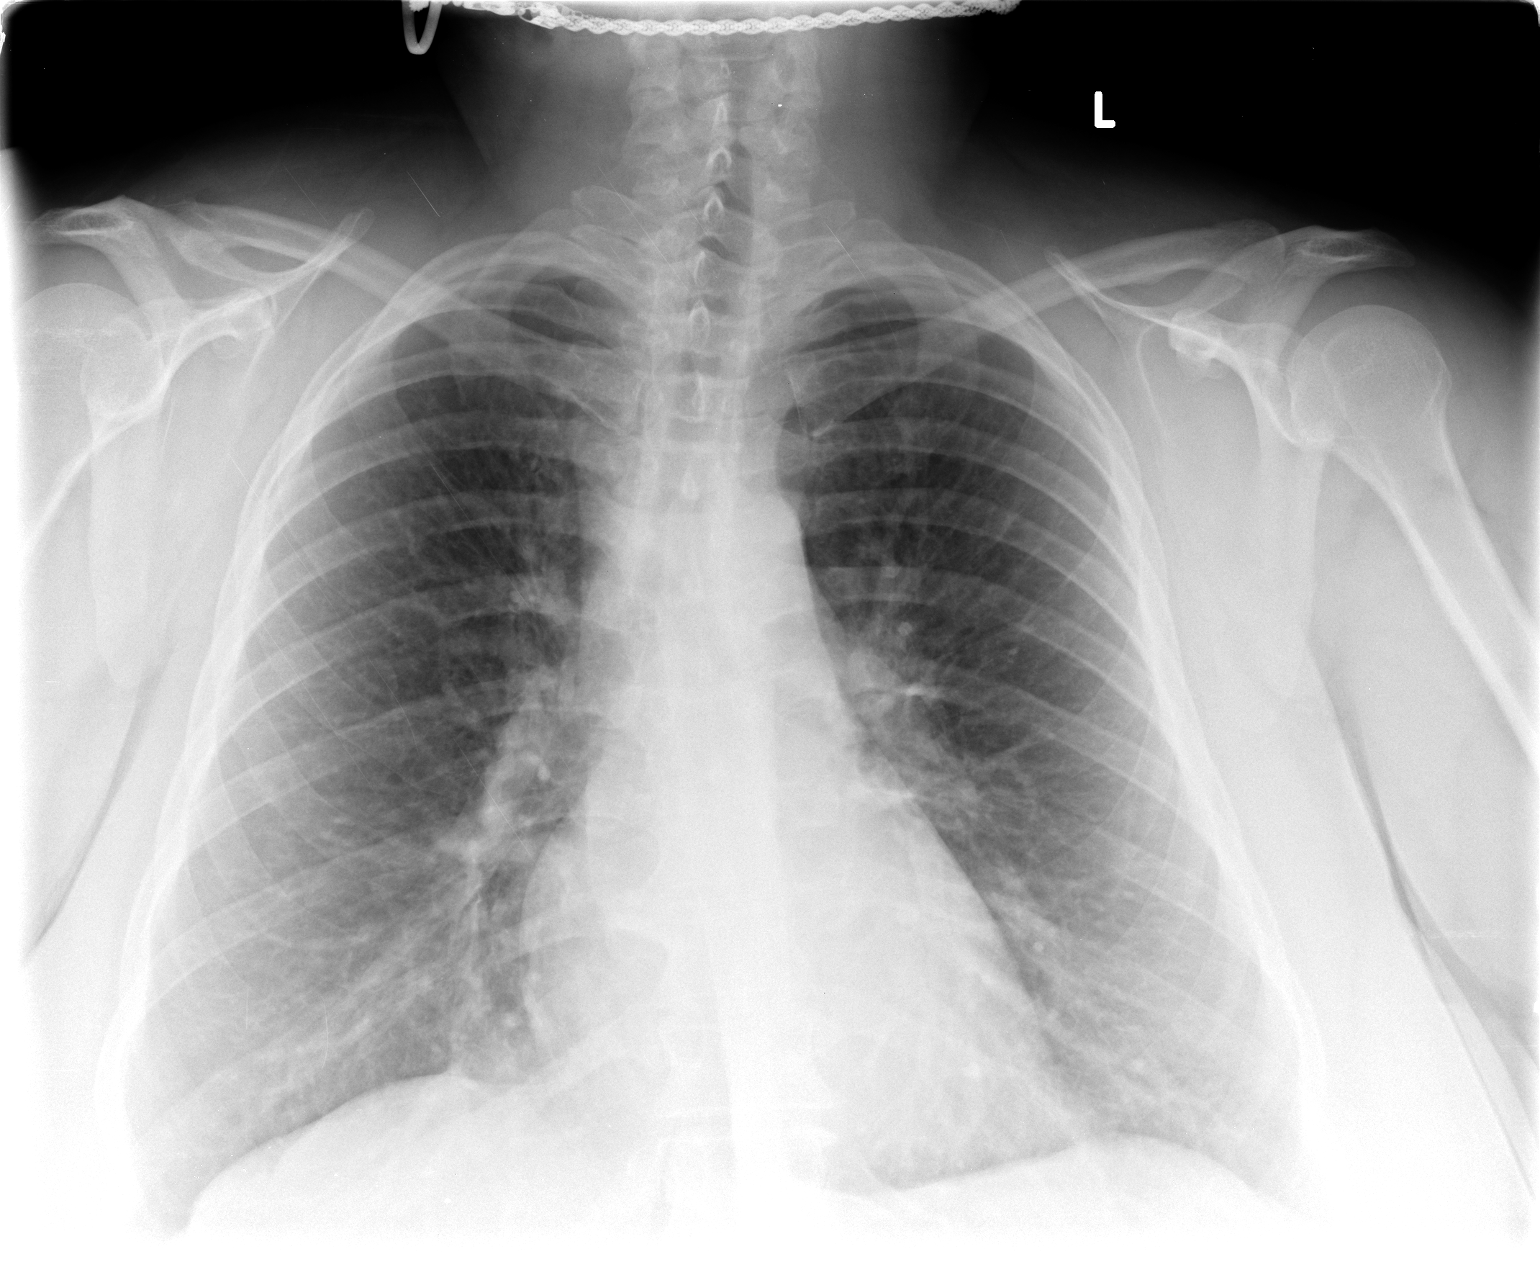

[view not recorded (2 of 2)]
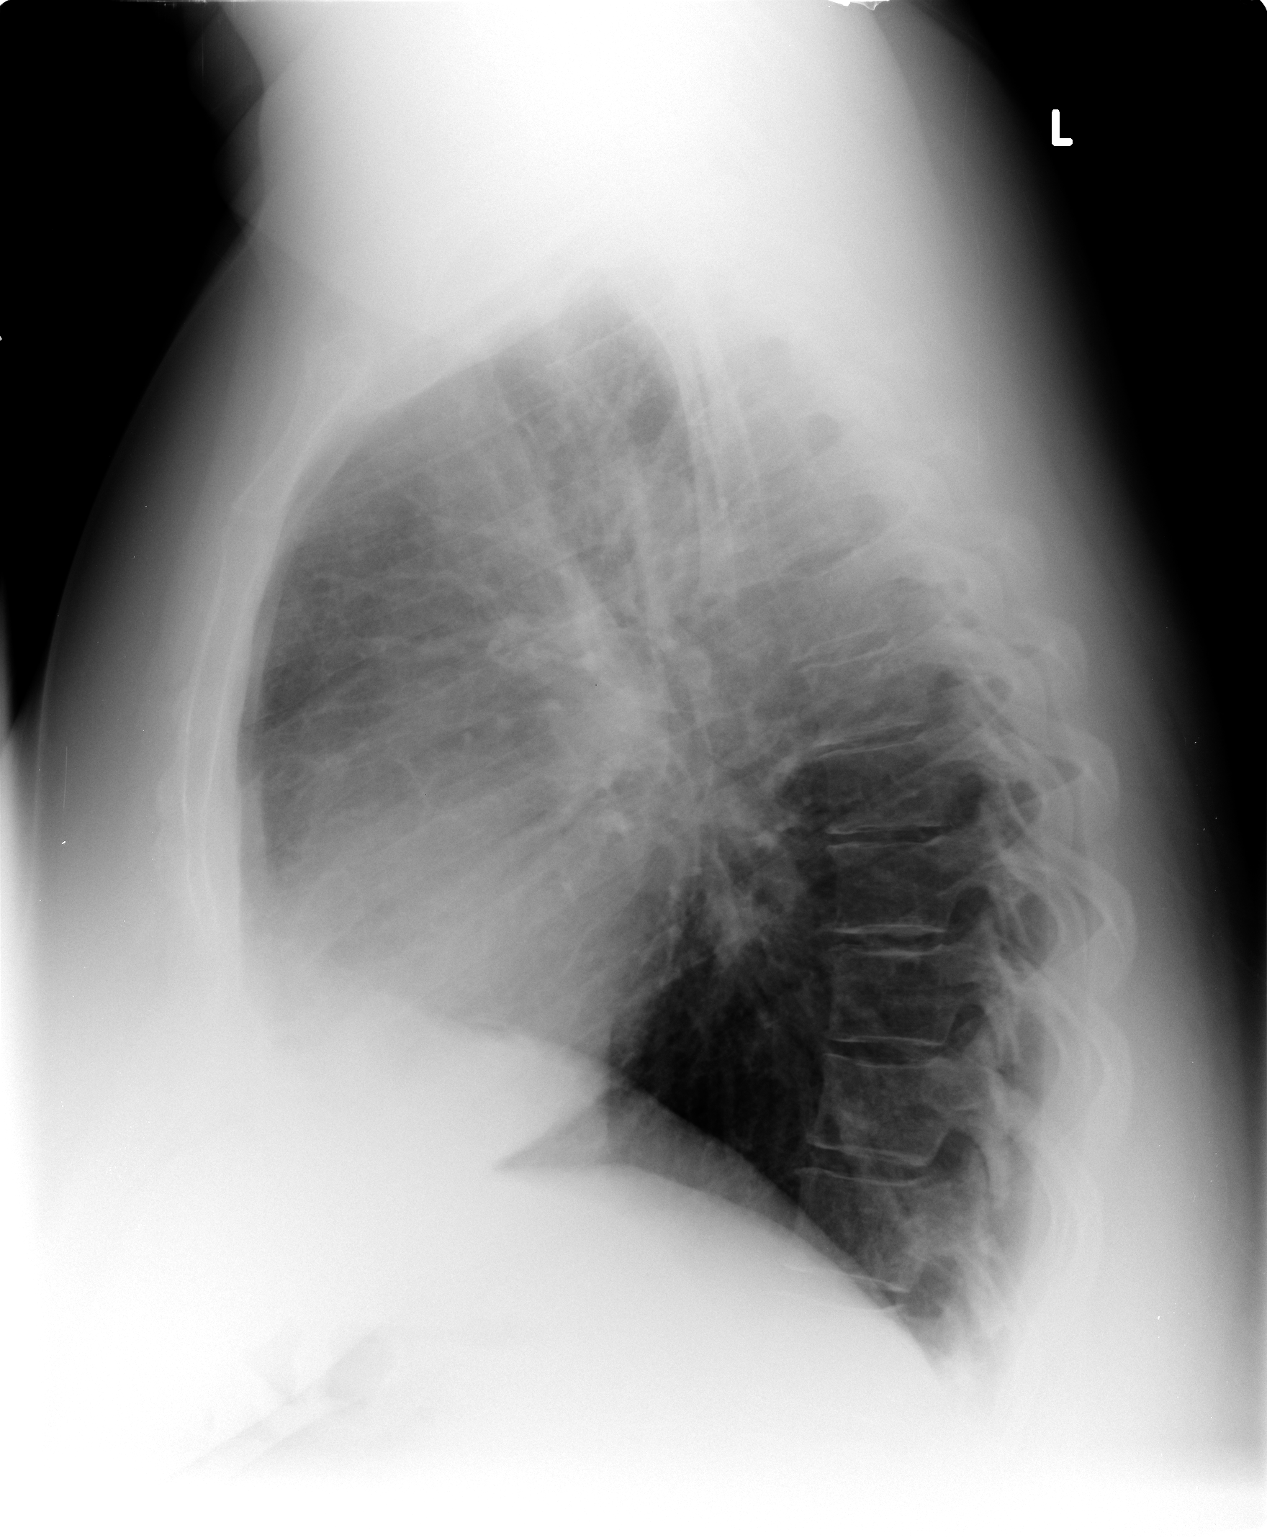

[2 of 2 positions shown; findings below may reference images not displayed]

FINDINGS: The lungs are clear. Peribronchial thickening is
identified.  Heart size is normal.  No pneumothorax or pleural
effusion.  No focal bony abnormality.
IMPRESSION: No acute disease.  Bronchitic change noted.

## 2013-08-04 ENCOUNTER — Emergency Department (HOSPITAL_COMMUNITY): Payer: Medicaid Other

## 2013-08-04 ENCOUNTER — Emergency Department (HOSPITAL_COMMUNITY)
Admission: EM | Admit: 2013-08-04 | Discharge: 2013-08-04 | Disposition: A | Discharge status: Home / Self Care | Payer: Medicaid Other | Attending: Emergency Medicine | Admitting: Emergency Medicine

## 2013-08-04 ENCOUNTER — Encounter (HOSPITAL_COMMUNITY): Payer: Self-pay | Admitting: Emergency Medicine

## 2013-08-04 ENCOUNTER — Other Ambulatory Visit: Payer: Self-pay

## 2013-08-04 DIAGNOSIS — Z8719 Personal history of other diseases of the digestive system: Secondary | ICD-10-CM | POA: Insufficient documentation

## 2013-08-04 DIAGNOSIS — F17200 Nicotine dependence, unspecified, uncomplicated: Secondary | ICD-10-CM | POA: Insufficient documentation

## 2013-08-04 DIAGNOSIS — Z87448 Personal history of other diseases of urinary system: Secondary | ICD-10-CM | POA: Insufficient documentation

## 2013-08-04 DIAGNOSIS — R0789 Other chest pain: Secondary | ICD-10-CM

## 2013-08-04 DIAGNOSIS — J4489 Other specified chronic obstructive pulmonary disease: Secondary | ICD-10-CM | POA: Insufficient documentation

## 2013-08-04 DIAGNOSIS — R5381 Other malaise: Secondary | ICD-10-CM | POA: Insufficient documentation

## 2013-08-04 DIAGNOSIS — R05 Cough: Secondary | ICD-10-CM

## 2013-08-04 DIAGNOSIS — Z79899 Other long term (current) drug therapy: Secondary | ICD-10-CM | POA: Insufficient documentation

## 2013-08-04 DIAGNOSIS — I10 Essential (primary) hypertension: Secondary | ICD-10-CM | POA: Insufficient documentation

## 2013-08-04 DIAGNOSIS — Z91040 Latex allergy status: Secondary | ICD-10-CM | POA: Insufficient documentation

## 2013-08-04 DIAGNOSIS — G8929 Other chronic pain: Secondary | ICD-10-CM | POA: Insufficient documentation

## 2013-08-04 DIAGNOSIS — J449 Chronic obstructive pulmonary disease, unspecified: Secondary | ICD-10-CM | POA: Insufficient documentation

## 2013-08-04 DIAGNOSIS — R071 Chest pain on breathing: Secondary | ICD-10-CM | POA: Insufficient documentation

## 2013-08-04 HISTORY — DX: Opioid dependence, uncomplicated: F11.20

## 2013-08-04 HISTORY — DX: Other psychoactive substance abuse, uncomplicated: F19.10

## 2013-08-04 HISTORY — DX: Acute kidney failure, unspecified: N17.9

## 2013-08-04 HISTORY — DX: Other chronic pain: G89.29

## 2013-08-04 LAB — CBC WITH DIFFERENTIAL/PLATELET
Eosinophils Relative: 2 % (ref 0–5)
HCT: 39.2 % (ref 36.0–46.0)
Hemoglobin: 13.2 g/dL (ref 12.0–15.0)
Lymphocytes Relative: 26 % (ref 12–46)
Lymphs Abs: 2.5 10*3/uL (ref 0.7–4.0)
MCV: 96.8 fL (ref 78.0–100.0)
Monocytes Absolute: 0.6 10*3/uL (ref 0.1–1.0)
Monocytes Relative: 7 % (ref 3–12)
Neutro Abs: 6.2 10*3/uL (ref 1.7–7.7)
RBC: 4.05 MIL/uL (ref 3.87–5.11)
WBC: 9.4 10*3/uL (ref 4.0–10.5)

## 2013-08-04 LAB — BASIC METABOLIC PANEL
BUN: 12 mg/dL (ref 6–23)
CO2: 31 mEq/L (ref 19–32)
Chloride: 97 mEq/L (ref 96–112)
Creatinine, Ser: 1.07 mg/dL (ref 0.50–1.10)
Potassium: 4 mEq/L (ref 3.5–5.1)

## 2013-08-04 MED ORDER — IPRATROPIUM BROMIDE 0.02 % IN SOLN
0.5000 mg | Freq: Once | RESPIRATORY_TRACT | Status: AC
  Administered 2013-08-04: 0.5 mg via RESPIRATORY_TRACT
  Filled 2013-08-04: qty 2.5

## 2013-08-04 MED ORDER — TIZANIDINE HCL 2 MG PO CAPS: 2.0000 mg | ORAL_CAPSULE | Freq: Three times a day (TID) | ORAL | Status: DC | PRN

## 2013-08-04 MED ORDER — ALBUTEROL SULFATE (5 MG/ML) 0.5% IN NEBU
5.0000 mg | INHALATION_SOLUTION | Freq: Once | RESPIRATORY_TRACT | Status: AC
  Administered 2013-08-04: 5 mg via RESPIRATORY_TRACT
  Filled 2013-08-04: qty 1

## 2013-08-04 MED ORDER — ALBUTEROL SULFATE HFA 108 (90 BASE) MCG/ACT IN AERS: 2.0000 | INHALATION_SPRAY | RESPIRATORY_TRACT | Status: DC | PRN

## 2013-08-04 NOTE — ED Notes (Signed)
Noted slurred speech.

## 2013-08-04 NOTE — ED Notes (Signed)
Respiratory called for breathing treatment.

## 2013-08-04 NOTE — ED Provider Notes (Signed)
CSN: [id number]     Arrival date & time 08/04/13  1614 History   First MD Initiated Contact with Patient 08/04/13 1650     Chief Complaint  Patient presents with  . Chest Pain    HPI Pt was seen at 1710.  Per pt, c/o gradual onset and persistence of constant chest "pain" for the past 3 days. Describes the pain as "squeezing." Pain worsens with body movement and palpation of the area. Has been associated with cough and generalized weakness/fatigue. Denies fevers, no rash, no palpitations, no N/V/D, no abd pain, no back pain.     Past Medical History  Diagnosis Date  . COPD (chronic obstructive pulmonary disease)   . Hypertension   . Gall stones   . Opiate addiction     stated she is in recovery but  "not an addict"  . Chronic pain   . Acute renal failure 09/2012    due to polysubstance abuse  . Polysubstance abuse    Past Surgical History  Procedure Laterality Date  . Cesarean section    . Abdominal hysterectomy    . Tubal ligation    . Mouth surgery     Family History  Problem Relation Age of Onset  . Diabetes Mother   . Hypertension Father    History  Substance Use Topics  . Smoking status: Current Every Day Smoker -- 1.00 packs/day    Types: Cigarettes  . Smokeless tobacco: Not on file  . Alcohol Use: No    Review of Systems ROS: Statement: All systems negative except as marked or noted in the HPI; Constitutional: Negative for fever and chills. +generalized weakness/fatigue.; ; Eyes: Negative for eye pain, redness and discharge. ; ; ENMT: Negative for ear pain, hoarseness, nasal congestion, sinus pressure and sore throat. ; ; Cardiovascular: Negative for palpitations, diaphoresis, dyspnea and peripheral edema. ; ; Respiratory: +cough. Negative for wheezing and stridor. ; ; Gastrointestinal: Negative for nausea, vomiting, diarrhea, abdominal pain, blood in stool, hematemesis, jaundice and rectal bleeding. . ; ; Genitourinary: Negative for dysuria, flank pain and  hematuria. ; ; Musculoskeletal: +chest wall pain. Negative for back pain and neck pain. Negative for swelling and trauma.; ; Skin: Negative for pruritus, rash, abrasions, blisters, bruising and skin lesion.; ; Neuro: Negative for headache, lightheadedness and neck stiffness. Negative for weakness, altered level of consciousness , altered mental status, extremity weakness, paresthesias, involuntary movement, seizure and syncope.       Allergies  Latex  Home Medications   Current Outpatient Rx  Name  Route  Sig  Dispense  Refill  . albuterol (VENTOLIN HFA) 108 (90 BASE) MCG/ACT inhaler   Inhalation   Inhale 2 puffs into the lungs daily as needed. For shortness of breath         . Buprenorphine HCl-Naloxone HCl (SUBOXONE) 8-2 MG FILM   Sublingual   Place 1 Film under the tongue 3 (three) times daily.         . clonazePAM (KLONOPIN) 1 MG tablet   Oral   Take 1 mg by mouth 4 (four) times daily.         . clonazePAM (KLONOPIN) 2 MG tablet   Oral   Take 2 mg by mouth at bedtime.         . gabapentin (NEURONTIN) 300 MG capsule   Oral   Take 600 mg by mouth 3 (three) times daily.         Marland Kitchen ibuprofen (ADVIL,MOTRIN) 200 MG tablet  Oral   Take 800 mg by mouth every 6 (six) hours as needed. Cramps.         . venlafaxine XR (EFFEXOR-XR) 75 MG 24 hr capsule   Oral   Take 225 mg by mouth daily.         Marland Kitchen albuterol (PROVENTIL HFA;VENTOLIN HFA) 108 (90 BASE) MCG/ACT inhaler   Inhalation   Inhale 2 puffs into the lungs every 4 (four) hours as needed for wheezing.   1 Inhaler   0   . tizanidine (ZANAFLEX) 2 MG capsule   Oral   Take 1 capsule (2 mg total) by mouth 3 (three) times daily as needed for muscle spasms.   12 capsule   0    BP 149/71  Pulse 82  Temp(Src) 98.1 F (36.7 C) (Oral)  Resp 19  Ht 5\' 7"  (1.702 m)  SpO2 100% Physical Exam 1715: Physical examination:  Nursing notes reviewed; Vital signs and O2 SAT reviewed;  Constitutional: Well developed,  Well nourished, Well hydrated, In no acute distress; Head:  Normocephalic, atraumatic; Eyes: EOMI, PERRL, No scleral icterus; ENMT: TM's clear bilat. +edemetous nasal turbinates bilat with clear rhinorrhea. Mouth and pharynx normal, Mucous membranes moist; Neck: Supple, Full range of motion, No lymphadenopathy; Cardiovascular: Regular rate and rhythm, No murmur, rub, or gallop; Respiratory: Breath sounds coarse & equal bilaterally, No wheezes.  Speaking full sentences with ease, Normal respiratory effort/excursion; Chest: +left chest wall tender to palp. No deformity, no rash, no soft tissue crepitus. Movement normal; Abdomen: Soft, Nontender, Nondistended, Normal bowel sounds; Genitourinary: No CVA tenderness; Extremities: Pulses normal, No tenderness, No edema, No calf edema or asymmetry.; Neuro: AA&Ox3, Major CN grossly intact.  Speech clear. No gross focal motor or sensory deficits in extremities.; Skin: Color normal, Warm, Dry.   ED Course  Procedures   EKG Interpretation   None       MDM  MDM Reviewed: previous chart, nursing note and vitals Reviewed previous: labs and ECG Interpretation: labs, ECG and x-ray      Date: 08/04/2013  Rate: 90  Rhythm: normal sinus rhythm  QRS Axis: normal  Intervals: normal  ST/T Wave abnormalities: normal  Conduction Disutrbances:none  Narrative Interpretation:   Old EKG Reviewed: unchanged; no significant changes from previous EKG dated 10/03/2012.   Results for orders placed during the hospital encounter of 08/04/13  CBC WITH DIFFERENTIAL      Result Value Range   WBC 9.4  4.0 - 10.5 K/uL   RBC 4.05  3.87 - 5.11 MIL/uL   Hemoglobin 13.2  12.0 - 15.0 g/dL   HCT 16.1  09.6 - 04.5 %   MCV 96.8  78.0 - 100.0 fL   MCH 32.6  26.0 - 34.0 pg   MCHC 33.7  30.0 - 36.0 g/dL   RDW 40.9  81.1 - 91.4 %   Platelets 307  150 - 400 K/uL   Neutrophils Relative % 65  43 - 77 %   Neutro Abs 6.2  1.7 - 7.7 K/uL   Lymphocytes Relative 26  12 - 46 %    Lymphs Abs 2.5  0.7 - 4.0 K/uL   Monocytes Relative 7  3 - 12 %   Monocytes Absolute 0.6  0.1 - 1.0 K/uL   Eosinophils Relative 2  0 - 5 %   Eosinophils Absolute 0.2  0.0 - 0.7 K/uL   Basophils Relative 0  0 - 1 %   Basophils Absolute 0.0  0.0 - 0.1 K/uL  TROPONIN I      Result Value Range   Troponin I <0.30  <0.30 ng/mL  BASIC METABOLIC PANEL      Result Value Range   Sodium 136  135 - 145 mEq/L   Potassium 4.0  3.5 - 5.1 mEq/L   Chloride 97  96 - 112 mEq/L   CO2 31  19 - 32 mEq/L   Glucose, Bld 107 (*) 70 - 99 mg/dL   BUN 12  6 - 23 mg/dL   Creatinine, Ser 1.61  0.50 - 1.10 mg/dL   Calcium 9.5  8.4 - 09.6 mg/dL   GFR calc non Af Amer 66 (*) >90 mL/min   GFR calc Af Amer 77 (*) >90 mL/min   Dg Chest 2 View 08/04/2013   CLINICAL DATA:  Chest pain, shortness of breath, weakness.  EXAM: CHEST  2 VIEW  COMPARISON:  09/23/2012  FINDINGS: Minimal chronic density at the medial right lung base, likely scarring. Heart is normal size. Left lung is clear. No effusions or acute bony abnormality.  IMPRESSION: No active cardiopulmonary disease.   Electronically Signed   By: Charlett Nose M.D.   On: 08/04/2013 17:05     1830:  Pt states she "feels better now" and wants to go home. Sats remain 100% R/A, lungs CTA bilat, resps easy, speaking in full sentences, NAD.  Requesting a refill of her neb and "some Zanaflex" for her chest wall pain. Will rx same. Doubt PE as cause for symptoms with low risk [place].  Doubt ACS as cause for symptoms with normal troponin and unchanged EKG from previous after 3 days of constant symptoms.  Dx and testing d/w pt and family.  Questions answered.  Verb understanding, agreeable to d/c home with outpt f/u.    Laray Anger, DO 08/07/13 1048

## 2013-08-04 NOTE — ED Notes (Signed)
Pt stated she is an "addict in recovery".  Had been given opiates for 11 years and is on suboxone.

## 2013-08-04 NOTE — ED Notes (Signed)
Pt given warm blanket in the lobby, scratched a raw area on her face that is now bleeding.

## 2013-08-04 NOTE — ED Notes (Signed)
Patient with no complaints at this time. Respirations even and unlabored. Skin warm/dry. Discharge instructions reviewed with patient at this time. Patient given opportunity to voice concerns/ask questions. Patient discharged at this time and left Emergency Department with steady gait.   

## 2013-08-04 NOTE — ED Notes (Signed)
Pt reports episodes of cp, off/on for 2 days, has been sick w/ cold. Thinks she may have pneumonia. No fever. Sob at times, +nausea and vomiting, weakness all over, pain is midsternal no radiation.  Feet and legs swollen off/on for 2 weeks. Has not been to her pmd for treatment.

## 2013-09-17 ENCOUNTER — Encounter (HOSPITAL_COMMUNITY): Payer: Self-pay | Admitting: Emergency Medicine

## 2013-09-17 ENCOUNTER — Emergency Department (HOSPITAL_COMMUNITY)
Admission: EM | Admit: 2013-09-17 | Discharge: 2013-09-17 | Disposition: A | Discharge status: Home / Self Care | Payer: Medicaid Other | Attending: Emergency Medicine | Admitting: Emergency Medicine

## 2013-09-17 ENCOUNTER — Emergency Department (HOSPITAL_COMMUNITY): Payer: Medicaid Other

## 2013-09-17 DIAGNOSIS — F17200 Nicotine dependence, unspecified, uncomplicated: Secondary | ICD-10-CM | POA: Insufficient documentation

## 2013-09-17 DIAGNOSIS — J40 Bronchitis, not specified as acute or chronic: Secondary | ICD-10-CM

## 2013-09-17 DIAGNOSIS — F1210 Cannabis abuse, uncomplicated: Secondary | ICD-10-CM | POA: Insufficient documentation

## 2013-09-17 DIAGNOSIS — Z8719 Personal history of other diseases of the digestive system: Secondary | ICD-10-CM | POA: Insufficient documentation

## 2013-09-17 DIAGNOSIS — E669 Obesity, unspecified: Secondary | ICD-10-CM | POA: Insufficient documentation

## 2013-09-17 DIAGNOSIS — G8929 Other chronic pain: Secondary | ICD-10-CM | POA: Insufficient documentation

## 2013-09-17 DIAGNOSIS — Z79899 Other long term (current) drug therapy: Secondary | ICD-10-CM | POA: Insufficient documentation

## 2013-09-17 DIAGNOSIS — I10 Essential (primary) hypertension: Secondary | ICD-10-CM | POA: Insufficient documentation

## 2013-09-17 DIAGNOSIS — Z87448 Personal history of other diseases of urinary system: Secondary | ICD-10-CM | POA: Insufficient documentation

## 2013-09-17 DIAGNOSIS — Z91040 Latex allergy status: Secondary | ICD-10-CM | POA: Insufficient documentation

## 2013-09-17 DIAGNOSIS — J441 Chronic obstructive pulmonary disease with (acute) exacerbation: Secondary | ICD-10-CM | POA: Insufficient documentation

## 2013-09-17 MED ORDER — BENZONATATE 100 MG PO CAPS: 100.0000 mg | ORAL_CAPSULE | Freq: Three times a day (TID) | ORAL | Status: DC

## 2013-09-17 MED ORDER — BENZONATATE 100 MG PO CAPS
200.0000 mg | ORAL_CAPSULE | Freq: Once | ORAL | Status: AC
  Administered 2013-09-17: 200 mg via ORAL
  Filled 2013-09-17: qty 2

## 2013-09-17 MED ORDER — ACETAMINOPHEN 325 MG PO TABS
650.0000 mg | ORAL_TABLET | Freq: Once | ORAL | Status: AC
  Administered 2013-09-17: 650 mg via ORAL
  Filled 2013-09-17: qty 2

## 2013-09-17 MED ORDER — ALBUTEROL SULFATE (5 MG/ML) 0.5% IN NEBU
5.0000 mg | INHALATION_SOLUTION | Freq: Once | RESPIRATORY_TRACT | Status: AC
  Administered 2013-09-17: 5 mg via RESPIRATORY_TRACT
  Filled 2013-09-17: qty 1

## 2013-09-17 MED ORDER — ALBUTEROL SULFATE HFA 108 (90 BASE) MCG/ACT IN AERS: 1.0000 | INHALATION_SPRAY | Freq: Four times a day (QID) | RESPIRATORY_TRACT | Status: AC | PRN

## 2013-09-17 MED ORDER — IPRATROPIUM BROMIDE 0.02 % IN SOLN
0.5000 mg | Freq: Once | RESPIRATORY_TRACT | Status: AC
  Administered 2013-09-17: 0.5 mg via RESPIRATORY_TRACT
  Filled 2013-09-17: qty 2.5

## 2013-09-17 MED ORDER — FLUTICASONE-SALMETEROL 100-50 MCG/DOSE IN AEPB: 1.0000 | INHALATION_SPRAY | RESPIRATORY_TRACT | Status: DC

## 2013-09-17 MED ORDER — AZITHROMYCIN 250 MG PO TABS: 250.0000 mg | ORAL_TABLET | Freq: Every day | ORAL | Status: DC

## 2013-09-17 MED ORDER — AZITHROMYCIN 250 MG PO TABS
500.0000 mg | ORAL_TABLET | Freq: Once | ORAL | Status: AC
  Administered 2013-09-17: 500 mg via ORAL
  Filled 2013-09-17: qty 2

## 2013-09-17 MED ORDER — ALBUTEROL SULFATE HFA 108 (90 BASE) MCG/ACT IN AERS
2.0000 | INHALATION_SPRAY | RESPIRATORY_TRACT | Status: DC | PRN
  Administered 2013-09-17: 2 via RESPIRATORY_TRACT
  Filled 2013-09-17: qty 6.7

## 2013-09-17 MED ORDER — PREDNISONE 50 MG PO TABS
60.0000 mg | ORAL_TABLET | Freq: Once | ORAL | Status: AC
  Administered 2013-09-17: 60 mg via ORAL
  Filled 2013-09-17 (×2): qty 1

## 2013-09-17 NOTE — ED Provider Notes (Signed)
CSN: [id number]     Arrival date & time 09/17/13  0146 History   First MD Initiated Contact with Patient 09/17/13 0206     Chief Complaint  Patient presents with  . Cough  . Shortness of Breath  . Chest Pain   (Consider location/radiation/quality/duration/timing/severity/associated sxs/prior Treatment) HPI History provided by patient. Feeling sick for the last few days with cough, congestion and wheezing. Patient isn't everyday smoker with history of COPD. She has some associated chest tightness. She denies any known sick contacts. No hemoptysis but some green sputum production. Patient states she has chronic bronchitis. She has ran out of her inhaler at home. She is requesting refills. No fevers or chills. No rash. No recent travel. Symptoms moderate in severity.  Past Medical History  Diagnosis Date  . COPD (chronic obstructive pulmonary disease)   . Hypertension   . Gall stones   . Opiate addiction     stated she is in recovery but  "not an addict"  . Chronic pain   . Acute renal failure 09/2012    due to polysubstance abuse  . Polysubstance abuse    Past Surgical History  Procedure Laterality Date  . Cesarean section    . Abdominal hysterectomy    . Tubal ligation    . Mouth surgery     Family History  Problem Relation Age of Onset  . Diabetes Mother   . Hypertension Father    History  Substance Use Topics  . Smoking status: Current Every Day Smoker -- 1.00 packs/day    Types: Cigarettes  . Smokeless tobacco: Not on file  . Alcohol Use: No   OB History   Grav Para Term Preterm Abortions TAB SAB Ect Mult Living                 Review of Systems  Constitutional: Negative for fever and chills.  HENT: Negative for sore throat, trouble swallowing and voice change.   Eyes: Negative for visual disturbance.  Respiratory: Positive for cough and wheezing.   Cardiovascular: Negative for leg swelling.  Gastrointestinal: Negative for abdominal pain.  Musculoskeletal:  Negative for back pain, neck pain and neck stiffness.  Skin: Negative for rash.  Neurological: Negative for headaches.  All other systems reviewed and are negative.    Allergies  Latex  Home Medications   Current Outpatient Rx  Name  Route  Sig  Dispense  Refill  . albuterol (PROVENTIL HFA;VENTOLIN HFA) 108 (90 BASE) MCG/ACT inhaler   Inhalation   Inhale 2 puffs into the lungs every 4 (four) hours as needed for wheezing.   1 Inhaler   0   . albuterol (VENTOLIN HFA) 108 (90 BASE) MCG/ACT inhaler   Inhalation   Inhale 2 puffs into the lungs daily as needed. For shortness of breath         . Buprenorphine HCl-Naloxone HCl (SUBOXONE) 8-2 MG FILM   Sublingual   Place 1 Film under the tongue 3 (three) times daily.         . clonazePAM (KLONOPIN) 1 MG tablet   Oral   Take 1 mg by mouth 4 (four) times daily.         . clonazePAM (KLONOPIN) 2 MG tablet   Oral   Take 2 mg by mouth at bedtime.         . gabapentin (NEURONTIN) 300 MG capsule   Oral   Take 600 mg by mouth 3 (three) times daily.         Marland Kitchen  ibuprofen (ADVIL,MOTRIN) 200 MG tablet   Oral   Take 800 mg by mouth every 6 (six) hours as needed. Cramps.         . tizanidine (ZANAFLEX) 2 MG capsule   Oral   Take 1 capsule (2 mg total) by mouth 3 (three) times daily as needed for muscle spasms.   12 capsule   0   . venlafaxine XR (EFFEXOR-XR) 75 MG 24 hr capsule   Oral   Take 225 mg by mouth daily.          BP 126/71  Pulse 108  Temp(Src) 97.5 F (36.4 C) (Oral)  Resp 18  Ht 5\' 7"  (1.702 m)  Wt 300 lb (136.079 kg)  BMI 46.98 kg/m2  SpO2 95% Physical Exam  Constitutional: She is oriented to person, place, and time. She appears well-developed and well-nourished.  HENT:  Head: Normocephalic and atraumatic.  Mouth/Throat: Oropharynx is clear and moist.  Eyes: EOM are normal. Pupils are equal, round, and reactive to light.  Neck: Neck supple.  Cardiovascular: Normal rate, regular rhythm and  intact distal pulses.   Pulmonary/Chest:  Bilateral expiratory wheezes without increased work of breathing  Abdominal:  Obese, soft nontender nondistended  Musculoskeletal: Normal range of motion. She exhibits no edema.  Neurological: She is alert and oriented to person, place, and time.  Skin: Skin is warm and dry.    ED Course  Procedures (including critical care time) Labs Review Labs Reviewed - No data to display Imaging Review Dg Chest 2 View  09/17/2013   CLINICAL DATA:  Short of breath. Cough and chest pain. COPD. Hypertension. Morbid obesity.  EXAM: CHEST  2 VIEW  COMPARISON:  08/04/2013  FINDINGS: Midline trachea. Normal heart size and mediastinal contours. No pleural effusion or pneumothorax. Clear lungs.  IMPRESSION: No acute cardiopulmonary disease.   Electronically Signed   By: Jeronimo Greaves M.D.   On: 09/17/2013 03:08   Room air pulse ox 94-95% is adequate  Albuterol Atrovent nebulized treatment provided. Prednisone provided. Tessalon for cough. Tylenol PO.   3:24 AM chest x-ray reviewed as above with patient and she is feeling somewhat better. She is requesting to be discharged home. Given comorbidities will prescribe Z-Pak, albuterol inhaler provided in the emergency department, and recommend close outpatient followup. Patient agrees to return precautions.  MDM   Diagnosis: Bronchitis versus URI with wheezing, history of COPD  Chest x-ray obtained and reviewed. Medications provided and condition improving Vital signs nurse's notes reviewed and considered     Sunnie Nielsen, MD 09/17/13 [phone]

## 2013-09-17 NOTE — ED Notes (Signed)
Patient reports history of COPD. Complains of shortness of breath, cough x 2 weeks, and chest pain.

## 2014-11-21 ENCOUNTER — Ambulatory Visit (INDEPENDENT_AMBULATORY_CARE_PROVIDER_SITE_OTHER): Payer: Medicaid Other | Admitting: Otolaryngology

## 2014-11-21 DIAGNOSIS — J3489 Other specified disorders of nose and nasal sinuses: Secondary | ICD-10-CM

## 2015-08-27 ENCOUNTER — Ambulatory Visit: Payer: Self-pay | Admitting: General Surgery

## 2015-08-27 NOTE — H&P (Signed)
History of Present Illness Nancy Lynn(Kaled Allende MD; 08/27/2015 2:22 PM) Patient words: Evaluate gallbladder.  The patient is a [age] year old female who presents for evaluation of gall stones. The patient is a [age] year old female who is referred by Dr. Wynelle Linksun for evaluation of symptomatic cholelithiasis. Patient states she's had this ongoing right upper quadrant epigastric pain for approximately 2 years. She presented been scheduled for surgery but canceled twice. Patient states that she has pain in the epigastrium, right upper quadrant since with nausea and vomiting after eating. She states that she is changing her diet and is taking less fatty food.  The patient does see a pain physician and is on Suboxone.  Patient has had previous C-section 5. She's also had oral and nasal surgery.   Other Problems [name](Christy Moore, CMA; 08/27/2015 1:52 PM) Anxiety Disorder Arthritis Back Pain Bladder Problems Chest pain Cholelithiasis Chronic Renal Failure Syndrome Depression Gastroesophageal Reflux Disease High blood pressure Hypercholesterolemia  Past Surgical History [name](Christy Moore, CMA; 08/27/2015 1:52 PM) Cesarean Section - Multiple Hysterectomy (not due to cancer) - Partial Oral Surgery  Diagnostic Studies History [name](Christy Moore, CMA; 08/27/2015 1:52 PM) Colonoscopy never Mammogram never Pap Smear >5 years ago  Allergies [name](Christy Moore, CMA; 08/27/2015 1:52 PM) Latex Exam Gloves *MEDICAL DEVICES AND SUPPLIES*  Medication History [name](Christy Moore, CMA; 08/27/2015 1:55 PM) Albuterol Sulfate (108 (90 Base)MCG/ACT Aero Pow Br Act, Inhalation) Active. Suboxone (8-2MG  Film, Sublingual) Active. KlonoPIN (2MG  Tablet, Oral) Active. Gabapentin (300MG  Capsule, Oral) Active. Zanaflex (2MG  Capsule, Oral) Active. Effexor (75MG  Tablet, Oral) Active. Advair Diskus (100-50MCG/DOSE Aero Pow Br Act, Inhalation) Active.  Social History [name]([place], [place]; 08/27/2015 1:52 PM) Alcohol  use Remotely quit alcohol use. Caffeine use Carbonated beverages. Tobacco use Current every day smoker.  Family History [name](Christy [place], [place]; 08/27/2015 1:52 PM) Alcohol Abuse Father. Arthritis Family Members In General, Mother, Son. Breast Cancer Family Members In General, Mother. Cerebrovascular Accident Family Members In General, Mother. Colon Polyps Family Members In General. Depression Daughter, Family Members In General, Mother, Sister, Son. Diabetes Mellitus Family Members In General. Heart Disease Family Members In General, Father, Mother. Heart disease in female family member before age 37 Heart disease in female family member before age 37 Hypertension Family Members In General, Father, Mother. Ovarian Cancer Sister. Prostate Cancer Family Members In General. Respiratory Condition Family Members In General, Mother. Seizure disorder Son.  Pregnancy / Birth History [name](Christy Moore, CMA; 08/27/2015 1:52 PM) Age at menarche 15 years. Gravida 5 Irregular periods Maternal age <15 Para 5    Review of Systems [name](Christy Moore CMA; 08/27/2015 1:52 PM) General Present- Appetite Loss and Fatigue. Not Present- Chills, Fever, Night Sweats, Weight Gain and Weight Loss. Skin Not Present- Change in Wart/Mole, Dryness, Hives, Jaundice, New Lesions, Non-Healing Wounds, Rash and Ulcer. HEENT Present- Hoarseness and Visual Disturbances. Not Present- Earache, Hearing Loss, Nose Bleed, Oral Ulcers, Ringing in the Ears, Seasonal Allergies, Sinus Pain, Sore Throat, Wears glasses/contact lenses and Yellow Eyes. Respiratory Present- Chronic Cough, Snoring and Wheezing. Not Present- Bloody sputum and Difficulty Breathing. Breast Not Present- Breast Mass, Breast Pain, Nipple Discharge and Skin Changes. Cardiovascular Present- Chest Pain, Difficulty Breathing Lying Down, Leg Cramps, Shortness of Breath and Swelling of Extremities. Not Present- Palpitations and Rapid Heart  Rate. Gastrointestinal Present- Abdominal Pain, Bloating, Change in Bowel Habits, Constipation, Gets full quickly at meals and Nausea. Not Present- Bloody Stool, Chronic diarrhea, Difficulty Swallowing, Excessive gas, Hemorrhoids, Indigestion, Rectal Pain and Vomiting. Musculoskeletal Present- Back Pain, Joint Pain, Joint Stiffness, Muscle Pain  and Swelling of Extremities. Not Present- Muscle Weakness. Neurological Present- Decreased Memory and Weakness. Not Present- Fainting, Headaches, Numbness, Seizures, Tingling, Tremor and Trouble walking. Psychiatric Present- Anxiety, Bipolar, Change in Sleep Pattern, Depression, Fearful and Frequent crying. Hematology Not Present- Easy Bruising, Excessive bleeding, Gland problems, HIV and Persistent Infections.  Vitals [name] CMA; 08/27/2015 1:52 PM) 08/27/2015 1:51 PM Weight: 332.6 lb Temp.: 98.5F(Temporal)  Pulse: 76 (Regular)  Resp.: 18 (Unlabored)  BP: 122/84 (Sitting, Left Arm, Standard)       Physical Exam Nancy Filler, MD; 08/27/2015 2:20 PM) General Mental Status-Alert. General Appearance-Consistent with stated age. Hydration-Well hydrated. Voice-Normal.  Head and Neck Head-normocephalic, atraumatic with no lesions or palpable masses.  Eye Eyeball - Bilateral-Extraocular movements intact. Sclera/Conjunctiva - Bilateral-No scleral icterus.  Chest and Lung Exam Chest and lung exam reveals -quiet, even and easy respiratory effort with no use of accessory muscles. Inspection Chest Wall - Normal. Back - normal.  Cardiovascular Cardiovascular examination reveals -normal heart sounds, regular rate and rhythm with no murmurs.  Abdomen Inspection Normal Exam - No Hernias. Palpation/Percussion Normal exam - Soft, Non Tender, No Rebound tenderness, No Rigidity (guarding) and No hepatosplenomegaly. Auscultation Normal exam - Bowel sounds normal.  Neurologic Neurologic evaluation reveals  -alert and oriented x 3 with no impairment of recent or remote memory. Mental Status-Normal.  Musculoskeletal Normal Exam - Left-Upper Extremity Strength Normal and Lower Extremity Strength Normal. Normal Exam - Right-Upper Extremity Strength Normal, Lower Extremity Weakness.    Assessment & Plan Nancy Filler MD; 08/27/2015 2:23 PM) SYMPTOMATIC CHOLELITHIASIS (K80.20) Impression: 39 year old female with symptomatic cholelithiasis  1. The patient would like to proceed to the operating for a laparoscopic cholecystectomy 2. Risks and benefits were discussed with the patient to generally include, but not limited to: infection, bleeding, possible need for post op ERCP, damage to the bile ducts, bile leak, and possible need for further surgery. Alternatives were offered and described. All questions were answered and the patient voiced understanding of the procedure and wishes to proceed at this point with a laparoscopic cholecystectomy

## 2015-09-02 NOTE — Pre-Procedure Instructions (Signed)
Virginia RochesterMalinda F Ibrahim  09/02/2015      [facility] [place], [place] - [id number] [place] [address] Phone: [phone] Fax: [phone]  [facility] - [place], [place] - [address] [place] [address] Phone: [phone] Fax: [phone]  [facility] [place], [place] - [address] [place] [address] Phone: [phone] Fax: [phone]    Your procedure is scheduled on Fri, Nov 18 @ 2:40 PM  [facility] Admitting at 12:40 PM  Call this number if you have problems the morning of surgery:  [phone]   Remember:  Do not eat food or drink liquids after midnight.  Take these medicines the morning of surgery with A SIP OF WATER Albuterol<Bring Your Inhaler With You>,Clonazepam(Klonopin),Gabapentin(Neurontin),and Metoprolol(Lopressor)              Stop taking your Ibuprofen and Phentermine. No Goody's,BC's,Aleve,Motrin,Fish Oil,Advil,or any Herbal Medications.    Do not wear jewelry, make-up or nail polish.  Do not wear lotions, powders, or perfumes.  You may wear deodorant.  Do not shave 48 hours prior to surgery.    Do not bring valuables to the hospital.  [facility] is not responsible for any belongings or valuables.  Contacts, dentures or bridgework may not be worn into surgery.  Leave your suitcase in the car.  After surgery it may be brought to your room.  For patients admitted to the hospital, discharge time will be determined by your treatment team.  Patients discharged the day of surgery will not be allowed to drive home.    Special instructions:  [facility] - Preparing for Surgery  Before surgery, you can play an important role.  Because skin is not sterile, your skin needs to be as free of germs as possible.  You can reduce the number of germs on you skin by washing with CHG (chlorahexidine gluconate) soap before surgery.  CHG is an  antiseptic cleaner which kills germs and bonds with the skin to continue killing germs even after washing.  Please DO NOT use if you have an allergy to CHG or antibacterial soaps.  If your skin becomes reddened/irritated stop using the CHG and inform your nurse when you arrive at Short Stay.  Do not shave (including legs and underarms) for at least 48 hours prior to the first CHG shower.  You may shave your face.  Please follow these instructions carefully:   1.  Shower with CHG Soap the night before surgery and the                                morning of Surgery.  2.  If you choose to wash your hair, wash your hair first as usual with your       normal shampoo.  3.  After you shampoo, rinse your hair and body thoroughly to remove the                      Shampoo.  4.  Use CHG as you would any other liquid soap.  You can apply chg directly       to the skin and wash gently with scrungie or a clean washcloth.  5.  Apply the CHG Soap to your body ONLY FROM THE NECK DOWN.  Do not use on open wounds or open sores.  Avoid contact with your eyes,       ears, mouth and genitals (private parts).  Wash genitals (private parts)       with your normal soap.  6.  Wash thoroughly, paying special attention to the area where your surgery        will be performed.  7.  Thoroughly rinse your body with warm water from the neck down.  8.  DO NOT shower/wash with your normal soap after using and rinsing off       the CHG Soap.  9.  Pat yourself dry with a clean towel.            10.  Wear clean pajamas.            11.  Place clean sheets on your bed the night of your first shower and do not        sleep with pets.  Day of Surgery  Do not apply any lotions/deoderants the morning of surgery.  Please wear clean clothes to the hospital/surgery center.    Please read over the following fact sheets that you were given. Pain Booklet, Coughing and Deep Breathing and Surgical Site Infection  Prevention

## 2015-09-03 ENCOUNTER — Inpatient Hospital Stay (HOSPITAL_COMMUNITY)
Admission: RE | Admit: 2015-09-03 | Discharge: 2015-09-03 | Disposition: A | Discharge status: Home / Self Care | Payer: Self-pay | Source: Ambulatory Visit

## 2015-09-04 ENCOUNTER — Inpatient Hospital Stay (HOSPITAL_COMMUNITY): Admission: RE | Admit: 2015-09-04 | Payer: Self-pay | Source: Ambulatory Visit

## 2015-09-04 MED ORDER — DEXTROSE 5 % IV SOLN
3.0000 g | INTRAVENOUS | Status: DC
  Filled 2015-09-04: qty 3000

## 2015-09-04 MED ORDER — CHLORHEXIDINE GLUCONATE 4 % EX LIQD: 1.0000 "application " | Freq: Once | CUTANEOUS | Status: DC

## 2015-09-05 ENCOUNTER — Ambulatory Visit (HOSPITAL_COMMUNITY): Admission: RE | Admit: 2015-09-05 | Payer: Medicaid Other | Source: Ambulatory Visit | Admitting: General Surgery

## 2015-09-05 ENCOUNTER — Encounter (HOSPITAL_COMMUNITY): Admission: RE | Payer: Self-pay | Source: Ambulatory Visit

## 2015-09-05 SURGERY — LAPAROSCOPIC CHOLECYSTECTOMY
Anesthesia: General

## 2017-11-25 ENCOUNTER — Other Ambulatory Visit (HOSPITAL_COMMUNITY): Payer: Self-pay | Admitting: Physician Assistant

## 2017-11-25 DIAGNOSIS — R1031 Right lower quadrant pain: Secondary | ICD-10-CM

## 2017-11-30 ENCOUNTER — Ambulatory Visit (HOSPITAL_COMMUNITY): Admission: RE | Admit: 2017-11-30 | Payer: Medicaid Other | Source: Ambulatory Visit

## 2018-01-11 ENCOUNTER — Encounter (HOSPITAL_BASED_OUTPATIENT_CLINIC_OR_DEPARTMENT_OTHER): Payer: Medicaid Other | Attending: Physician Assistant

## 2018-01-11 DIAGNOSIS — G629 Polyneuropathy, unspecified: Secondary | ICD-10-CM | POA: Diagnosis not present

## 2018-01-11 DIAGNOSIS — Z6841 Body Mass Index (BMI) 40.0 and over, adult: Secondary | ICD-10-CM | POA: Insufficient documentation

## 2018-01-11 DIAGNOSIS — L97822 Non-pressure chronic ulcer of other part of left lower leg with fat layer exposed: Secondary | ICD-10-CM | POA: Diagnosis present

## 2018-01-11 DIAGNOSIS — E669 Obesity, unspecified: Secondary | ICD-10-CM | POA: Diagnosis not present

## 2018-01-11 DIAGNOSIS — I10 Essential (primary) hypertension: Secondary | ICD-10-CM | POA: Diagnosis not present

## 2018-01-11 DIAGNOSIS — I872 Venous insufficiency (chronic) (peripheral): Secondary | ICD-10-CM | POA: Diagnosis not present

## 2018-01-11 DIAGNOSIS — J449 Chronic obstructive pulmonary disease, unspecified: Secondary | ICD-10-CM | POA: Diagnosis not present

## 2018-01-11 DIAGNOSIS — H548 Legal blindness, as defined in USA: Secondary | ICD-10-CM | POA: Diagnosis not present

## 2018-01-11 DIAGNOSIS — F17210 Nicotine dependence, cigarettes, uncomplicated: Secondary | ICD-10-CM | POA: Diagnosis not present

## 2018-01-18 ENCOUNTER — Encounter (HOSPITAL_BASED_OUTPATIENT_CLINIC_OR_DEPARTMENT_OTHER): Payer: Medicaid Other | Attending: Physician Assistant

## 2018-01-18 DIAGNOSIS — I872 Venous insufficiency (chronic) (peripheral): Secondary | ICD-10-CM | POA: Insufficient documentation

## 2018-01-18 DIAGNOSIS — E669 Obesity, unspecified: Secondary | ICD-10-CM | POA: Insufficient documentation

## 2018-01-18 DIAGNOSIS — L97822 Non-pressure chronic ulcer of other part of left lower leg with fat layer exposed: Secondary | ICD-10-CM | POA: Insufficient documentation

## 2018-01-18 DIAGNOSIS — Z6841 Body Mass Index (BMI) 40.0 and over, adult: Secondary | ICD-10-CM | POA: Insufficient documentation

## 2018-01-18 DIAGNOSIS — I10 Essential (primary) hypertension: Secondary | ICD-10-CM | POA: Insufficient documentation

## 2018-01-18 DIAGNOSIS — J449 Chronic obstructive pulmonary disease, unspecified: Secondary | ICD-10-CM | POA: Insufficient documentation

## 2018-01-18 DIAGNOSIS — F17210 Nicotine dependence, cigarettes, uncomplicated: Secondary | ICD-10-CM | POA: Insufficient documentation

## 2018-01-25 DIAGNOSIS — I872 Venous insufficiency (chronic) (peripheral): Secondary | ICD-10-CM | POA: Diagnosis not present

## 2018-01-25 DIAGNOSIS — E669 Obesity, unspecified: Secondary | ICD-10-CM | POA: Diagnosis not present

## 2018-01-25 DIAGNOSIS — J449 Chronic obstructive pulmonary disease, unspecified: Secondary | ICD-10-CM | POA: Diagnosis not present

## 2018-01-25 DIAGNOSIS — L97822 Non-pressure chronic ulcer of other part of left lower leg with fat layer exposed: Secondary | ICD-10-CM | POA: Diagnosis present

## 2018-01-25 DIAGNOSIS — F17210 Nicotine dependence, cigarettes, uncomplicated: Secondary | ICD-10-CM | POA: Diagnosis not present

## 2018-01-25 DIAGNOSIS — Z6841 Body Mass Index (BMI) 40.0 and over, adult: Secondary | ICD-10-CM | POA: Diagnosis not present

## 2018-01-25 DIAGNOSIS — I10 Essential (primary) hypertension: Secondary | ICD-10-CM | POA: Diagnosis not present

## 2018-02-08 ENCOUNTER — Ambulatory Visit: Payer: Self-pay | Admitting: Family

## 2018-02-08 NOTE — Progress Notes (Deleted)
Subjective:    Patient ID: Nancy Lynn, female    DOB: [birth year], 42 y.o.   MRN: [id number]  No chief complaint on file.   HPI:  Nancy Lynn is a 42 y.o. female with a previous medical history of legal blindness, hypertension, COPD, polysubstance abuse, and obesity who presents today for initial evaluation and treatment of recurrent cellulitis.   Nancy Lynn was [facility] March 2019 with edema of her bilateral lower extremities that started about 6 weeks prior to presentation. Patient indicated that one month prior to her presentation she had cellulitis that was treated with 10 days of 750 mg of levofloxacin which helped. 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 There was no evidence of infection noted on exam and believed to be stasis dermatitis, however patient requested to see ID secondary to recurrent cellulitis.   Allergies  Allergen Reactions  . Latex Hives  . Morphine And Related Nausea And Vomiting      Outpatient Medications Prior to Visit  Medication Sig Dispense Refill  . albuterol (PROVENTIL HFA;VENTOLIN HFA) 108 (90 BASE) MCG/ACT inhaler Inhale 1-2 puffs into the lungs every 6 (six) hours as needed for wheezing or shortness of breath. 1 Inhaler 0  . Buprenorphine HCl (SUBUTEX SL) Place 1 tablet under the tongue 3 (three) times daily.    . clonazePAM (KLONOPIN) 1 MG tablet Take 1 mg by mouth 4 (four) times daily.    . clonazePAM (KLONOPIN) 2 MG tablet Take 2 mg by mouth at bedtime.    . furosemide (LASIX) 40 MG tablet Take 40 mg by mouth 2 (two) times daily.    Marland Kitchen. gabapentin (NEURONTIN) 300 MG capsule Take 600 mg by mouth 3 (three) times daily.    Marland Kitchen. guaiFENesin (MUCINEX) 600 MG 12 hr tablet Take 1,200 mg by mouth 2 (two) times daily as needed for to loosen phlegm.    Marland Kitchen. ibuprofen (ADVIL,MOTRIN) 200 MG tablet Take 800 mg by mouth every  6 (six) hours as needed. Cramps.    . metoprolol (LOPRESSOR) 50 MG tablet Take 50 mg by mouth 2 (two) times daily.    . phentermine 37.5 MG capsule Take 37.5 mg by mouth every morning.    . potassium chloride SA (K-DUR,KLOR-CON) 20 MEQ tablet Take 20 mEq by mouth 2 (two) times daily.     No facility-administered medications prior to visit.      Past Medical History:  Diagnosis Date  . Acute renal failure 09/2012   due to polysubstance abuse  . Chronic pain   . COPD (chronic obstructive pulmonary disease)   . Gall stones   . Hypertension   . Opiate addiction    stated she is in recovery but  "not an addict"  . Polysubstance abuse       Past Surgical History:  Procedure Laterality Date  . ABDOMINAL HYSTERECTOMY    . CESAREAN SECTION    . MOUTH SURGERY    . TUBAL LIGATION        Family History  Problem Relation Age of Onset  . Diabetes Mother   . Hypertension Father       Social History   Socioeconomic History  . Marital status: Single    Spouse name: Not on file  . Number of children: Not on file  . Years of education: Not on file  . Highest  Highest education level: Not on file  Occupational History  . Not on file  Social Needs  . Financial resource strain: Not on file  . Food insecurity:    Worry: Not on file    Inability: Not on file  . Transportation needs:    Medical: Not on file    Non-medical: Not on file  Tobacco Use  . Smoking status: Current Every Day Smoker    Packs/day: 1.00    Types: Cigarettes  Substance and Sexual Activity  . Alcohol use: No  . Drug use: Yes    Types: Marijuana  . Sexual activity: Yes    Birth control/protection: Surgical  Lifestyle  . Physical activity:    Days per week: Not on file    Minutes per session: Not on file  . Stress: Not on file  Relationships  . Social connections:    Talks on phone: Not on file    Gets together: Not on file    Attends religious service: Not on file    Active member of club or  organization: Not on file    Attends meetings of clubs or organizations: Not on file    Relationship status: Not on file  . Intimate partner violence:    Fear of current or ex partner: Not on file    Emotionally abused: Not on file    Physically abused: Not on file    Forced sexual activity: Not on file  Other Topics Concern  . Not on file  Social History Narrative  . Not on file      Review of Systems     Objective:    There were no vitals taken for this visit. Nursing note and vital signs reviewed.  Physical Exam      Assessment & Plan:   Problem List Items Addressed This Visit    None       I am having Nancy Lynn maintain her ibuprofen, clonazePAM, clonazePAM, gabapentin, albuterol, Buprenorphine HCl (SUBUTEX SL), furosemide, metoprolol tartrate, guaiFENesin, potassium chloride SA, and phentermine.   No orders of the defined types were placed in this encounter.    Follow-up: No follow-ups on file.  Morelia Cassells, FNP [facility]  

## 2018-03-02 ENCOUNTER — Ambulatory Visit: Payer: Self-pay | Admitting: Family

## 2018-03-08 ENCOUNTER — Ambulatory Visit: Payer: Self-pay | Admitting: Family

## 2018-03-08 NOTE — Progress Notes (Deleted)
Subjective:    Patient ID: Nancy Lynn, female    DOB: 1976-01-08, 42 y.o.   MRN: [id number]  No chief complaint on file.  HPI:   Nancy Lynn is a 42 y.o. female with a previous medical history of legal blindness, hypertension, COPD, polysubstance abuse, and obesity who presents today for initial evaluation and treatment of recurrent cellulitis.   Ms. Meadow was initially seen in the [facility] in March 2019 with edema of her bilateral lower extremities that started about 6 weeks prior to presentation. Patient indicated that one month prior to her presentation she had cellulitis that was treated with 10 days of 750 mg of levofloxacin which helped. 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 There was no evidence of infection noted on exam and believed to be stasis dermatitis, however patient requested to see ID secondary to recurrent cellulitis.    Allergies  Allergen Reactions  . Latex Hives  . Morphine And Related Nausea And Vomiting      Outpatient Medications Prior to Visit  Medication Sig Dispense Refill  . albuterol (PROVENTIL HFA;VENTOLIN HFA) 108 (90 BASE) MCG/ACT inhaler Inhale 1-2 puffs into the lungs every 6 (six) hours as needed for wheezing or shortness of breath. 1 Inhaler 0  . Buprenorphine HCl (SUBUTEX SL) Place 1 tablet under the tongue 3 (three) times daily.    . clonazePAM (KLONOPIN) 1 MG tablet Take 1 mg by mouth 4 (four) times daily.    . clonazePAM (KLONOPIN) 2 MG tablet Take 2 mg by mouth at bedtime.    . furosemide (LASIX) 40 MG tablet Take 40 mg by mouth 2 (two) times daily.    Marland Kitchen gabapentin (NEURONTIN) 300 MG capsule Take 600 mg by mouth 3 (three) times daily.    Marland Kitchen guaiFENesin (MUCINEX) 600 MG 12 hr tablet Take 1,200 mg by mouth 2 (two) times daily as needed for to loosen phlegm.    Marland Kitchen ibuprofen (ADVIL,MOTRIN) 200 MG tablet Take 800 mg by mouth  every 6 (six) hours as needed. Cramps.    . metoprolol (LOPRESSOR) 50 MG tablet Take 50 mg by mouth 2 (two) times daily.    . phentermine 37.5 MG capsule Take 37.5 mg by mouth every morning.    . potassium chloride SA (K-DUR,KLOR-CON) 20 MEQ tablet Take 20 mEq by mouth 2 (two) times daily.     No facility-administered medications prior to visit.      Past Medical History:  Diagnosis Date  . Acute renal failure 09/2012   due to polysubstance abuse  . Chronic pain   . COPD (chronic obstructive pulmonary disease)   . Gall stones   . Hypertension   . Opiate addiction    stated she is in recovery but  "not an addict"  . Polysubstance abuse       Past Surgical History:  Procedure Laterality Date  . ABDOMINAL HYSTERECTOMY    . CESAREAN SECTION    . MOUTH SURGERY    . TUBAL LIGATION        Family History  Problem Relation Age of Onset  . Diabetes Mother   . Hypertension Father       Social History   Socioeconomic History  . Marital status: Single    Spouse name: Not on file  . Number of children: Not on file  . Years of education: Not on file  .  Highest education level: Not on file  Occupational History  . Not on file  Social Needs  . Financial resource strain: Not on file  . Food insecurity:    Worry: Not on file    Inability: Not on file  . Transportation needs:    Medical: Not on file    Non-medical: Not on file  Tobacco Use  . Smoking status: Current Every Day Smoker    Packs/day: 1.00    Types: Cigarettes  Substance and Sexual Activity  . Alcohol use: No  . Drug use: Yes    Types: Marijuana  . Sexual activity: Yes    Birth control/protection: Surgical  Lifestyle  . Physical activity:    Days per week: Not on file    Minutes per session: Not on file  . Stress: Not on file  Relationships  . Social connections:    Talks on phone: Not on file    Gets together: Not on file    Attends religious service: Not on file    Active member of club or  organization: Not on file    Attends meetings of clubs or organizations: Not on file    Relationship status: Not on file  . Intimate partner violence:    Fear of current or ex partner: Not on file    Emotionally abused: Not on file    Physically abused: Not on file    Forced sexual activity: Not on file  Other Topics Concern  . Not on file  Social History Narrative  . Not on file      Review of Systems     Objective:    There were no vitals taken for this visit. Nursing note and vital signs reviewed.  Physical Exam      Assessment & Plan:   Problem List Items Addressed This Visit    None       I am having Arvetta F. Stodghill maintain her ibuprofen, clonazePAM, clonazePAM, gabapentin, albuterol, Buprenorphine HCl (SUBUTEX SL), furosemide, metoprolol tartrate, guaiFENesin, potassium chloride SA, and phentermine.   No orders of the defined types were placed in this encounter.    Follow-up: No follow-ups on file.  Jeanine Luz, FNP [facility]

## 2018-03-20 ENCOUNTER — Encounter (HOSPITAL_BASED_OUTPATIENT_CLINIC_OR_DEPARTMENT_OTHER): Payer: Medicaid Other | Attending: Internal Medicine

## 2018-03-20 DIAGNOSIS — I890 Lymphedema, not elsewhere classified: Secondary | ICD-10-CM | POA: Insufficient documentation

## 2018-03-20 DIAGNOSIS — F17210 Nicotine dependence, cigarettes, uncomplicated: Secondary | ICD-10-CM | POA: Diagnosis not present

## 2018-03-20 DIAGNOSIS — L97222 Non-pressure chronic ulcer of left calf with fat layer exposed: Secondary | ICD-10-CM | POA: Insufficient documentation

## 2018-03-20 DIAGNOSIS — J449 Chronic obstructive pulmonary disease, unspecified: Secondary | ICD-10-CM | POA: Diagnosis not present

## 2018-03-20 DIAGNOSIS — I87332 Chronic venous hypertension (idiopathic) with ulcer and inflammation of left lower extremity: Secondary | ICD-10-CM | POA: Diagnosis present

## 2018-03-20 DIAGNOSIS — I10 Essential (primary) hypertension: Secondary | ICD-10-CM | POA: Diagnosis not present

## 2018-03-20 DIAGNOSIS — Z6841 Body Mass Index (BMI) 40.0 and over, adult: Secondary | ICD-10-CM | POA: Diagnosis not present

## 2018-03-28 DIAGNOSIS — I87332 Chronic venous hypertension (idiopathic) with ulcer and inflammation of left lower extremity: Secondary | ICD-10-CM | POA: Diagnosis not present

## 2019-07-30 ENCOUNTER — Encounter (HOSPITAL_BASED_OUTPATIENT_CLINIC_OR_DEPARTMENT_OTHER): Payer: Medicaid Other | Admitting: Internal Medicine

## 2019-08-02 ENCOUNTER — Ambulatory Visit (HOSPITAL_BASED_OUTPATIENT_CLINIC_OR_DEPARTMENT_OTHER): Payer: Medicaid Other | Admitting: Internal Medicine

## 2019-08-03 ENCOUNTER — Encounter (HOSPITAL_BASED_OUTPATIENT_CLINIC_OR_DEPARTMENT_OTHER): Payer: Medicaid Other | Admitting: Internal Medicine

## 2019-08-21 ENCOUNTER — Encounter (HOSPITAL_BASED_OUTPATIENT_CLINIC_OR_DEPARTMENT_OTHER): Payer: Medicaid Other | Attending: Internal Medicine | Admitting: Internal Medicine

## 2019-08-23 ENCOUNTER — Emergency Department (HOSPITAL_COMMUNITY): Payer: Medicaid Other

## 2019-08-23 ENCOUNTER — Other Ambulatory Visit: Payer: Self-pay

## 2019-08-23 ENCOUNTER — Encounter (HOSPITAL_COMMUNITY): Payer: Self-pay

## 2019-08-23 ENCOUNTER — Emergency Department (HOSPITAL_COMMUNITY)
Admission: EM | Admit: 2019-08-23 | Discharge: 2019-09-18 | Disposition: E | Payer: Medicaid Other | Attending: Emergency Medicine | Admitting: Emergency Medicine

## 2019-08-23 DIAGNOSIS — I10 Essential (primary) hypertension: Secondary | ICD-10-CM | POA: Insufficient documentation

## 2019-08-23 DIAGNOSIS — F17210 Nicotine dependence, cigarettes, uncomplicated: Secondary | ICD-10-CM | POA: Insufficient documentation

## 2019-08-23 DIAGNOSIS — I469 Cardiac arrest, cause unspecified: Secondary | ICD-10-CM | POA: Insufficient documentation

## 2019-08-23 DIAGNOSIS — J449 Chronic obstructive pulmonary disease, unspecified: Secondary | ICD-10-CM | POA: Diagnosis not present

## 2019-08-23 DIAGNOSIS — Z9981 Dependence on supplemental oxygen: Secondary | ICD-10-CM | POA: Insufficient documentation

## 2019-08-23 DIAGNOSIS — Z20828 Contact with and (suspected) exposure to other viral communicable diseases: Secondary | ICD-10-CM | POA: Diagnosis not present

## 2019-08-23 DIAGNOSIS — R0602 Shortness of breath: Secondary | ICD-10-CM | POA: Diagnosis present

## 2019-08-23 DIAGNOSIS — R0902 Hypoxemia: Secondary | ICD-10-CM | POA: Diagnosis not present

## 2019-08-23 DIAGNOSIS — Z79899 Other long term (current) drug therapy: Secondary | ICD-10-CM | POA: Diagnosis not present

## 2019-08-23 LAB — CBC WITH DIFFERENTIAL/PLATELET
Abs Immature Granulocytes: 0.15 10*3/uL — ABNORMAL HIGH (ref 0.00–0.07)
Basophils Absolute: 0.1 10*3/uL (ref 0.0–0.1)
Basophils Relative: 0 %
Eosinophils Absolute: 0.5 10*3/uL (ref 0.0–0.5)
Eosinophils Relative: 2 %
HCT: 45.9 % (ref 36.0–46.0)
Hemoglobin: 14.5 g/dL (ref 12.0–15.0)
Immature Granulocytes: 1 %
Lymphocytes Relative: 10 %
Lymphs Abs: 2.1 10*3/uL (ref 0.7–4.0)
MCH: 27.6 pg (ref 26.0–34.0)
MCHC: 31.6 g/dL (ref 30.0–36.0)
MCV: 87.4 fL (ref 80.0–100.0)
Monocytes Absolute: 1.1 10*3/uL — ABNORMAL HIGH (ref 0.1–1.0)
Monocytes Relative: 5 %
Neutro Abs: 17.2 10*3/uL — ABNORMAL HIGH (ref 1.7–7.7)
Neutrophils Relative %: 82 %
Platelets: 282 10*3/uL (ref 150–400)
RBC: 5.25 MIL/uL — ABNORMAL HIGH (ref 3.87–5.11)
RDW: 15.6 % — ABNORMAL HIGH (ref 11.5–15.5)
WBC: 21 10*3/uL — ABNORMAL HIGH (ref 4.0–10.5)
nRBC: 0 % (ref 0.0–0.2)

## 2019-08-23 LAB — COMPREHENSIVE METABOLIC PANEL
ALT: 27 U/L (ref 0–44)
AST: 40 U/L (ref 15–41)
Albumin: 3.6 g/dL (ref 3.5–5.0)
Alkaline Phosphatase: 67 U/L (ref 38–126)
Anion gap: 10 (ref 5–15)
BUN: 12 mg/dL (ref 6–20)
CO2: 23 mmol/L (ref 22–32)
Calcium: 8.6 mg/dL — ABNORMAL LOW (ref 8.9–10.3)
Chloride: 104 mmol/L (ref 98–111)
Creatinine, Ser: 0.94 mg/dL (ref 0.44–1.00)
GFR calc Af Amer: >60 mL/min (ref >60)
GFR calc non Af Amer: >60 mL/min (ref >60)
Glucose, Bld: 101 mg/dL — ABNORMAL HIGH (ref 70–99)
Potassium: 3.7 mmol/L (ref 3.5–5.1)
Sodium: 137 mmol/L (ref 135–145)
Total Bilirubin: 1.9 mg/dL — ABNORMAL HIGH (ref 0.3–1.2)
Total Protein: 7 g/dL (ref 6.5–8.1)

## 2019-08-23 LAB — PROCALCITONIN: Procalcitonin: <0.1 ng/mL

## 2019-08-23 LAB — BRAIN NATRIURETIC PEPTIDE: B Natriuretic Peptide: 359 pg/mL — ABNORMAL HIGH (ref 0.0–100.0)

## 2019-08-23 LAB — APTT: aPTT: 30 seconds (ref 24–36)

## 2019-08-23 LAB — LACTIC ACID, PLASMA: Lactic Acid, Venous: 1 mmol/L (ref 0.5–1.9)

## 2019-08-23 LAB — PROTIME-INR
INR: 1.1 (ref 0.8–1.2)
Prothrombin Time: 14.4 seconds (ref 11.4–15.2)

## 2019-08-23 LAB — SARS CORONAVIRUS 2 (TAT 6-24 HRS): SARS Coronavirus 2: NEGATIVE

## 2019-08-23 MED ORDER — ALTEPLASE 100 MG IV SOLR
INTRAVENOUS | Status: AC
  Filled 2019-08-23: qty 100

## 2019-08-23 MED ORDER — EPINEPHRINE 1 MG/10ML IJ SOSY
PREFILLED_SYRINGE | INTRAMUSCULAR | Status: AC | PRN
  Administered 2019-08-23 (×2): 1 mg via INTRAVENOUS

## 2019-08-23 MED ORDER — SODIUM CHLORIDE 0.9 % IV BOLUS (SEPSIS): 800.0000 mL | Freq: Once | INTRAVENOUS | Status: DC

## 2019-08-23 MED ORDER — SODIUM CHLORIDE 0.9 % IV SOLN: 500.0000 mg | INTRAVENOUS | Status: DC

## 2019-08-23 MED ORDER — SODIUM CHLORIDE 0.9 % IV BOLUS (SEPSIS): 1000.0000 mL | Freq: Once | INTRAVENOUS | Status: DC

## 2019-08-23 MED ORDER — SODIUM CHLORIDE 0.9 % IV SOLN: 2.0000 g | INTRAVENOUS | Status: DC

## 2019-08-23 MED ORDER — EPINEPHRINE 1 MG/10ML IJ SOSY
PREFILLED_SYRINGE | INTRAMUSCULAR | Status: AC | PRN
  Administered 2019-08-23: 1 mg via INTRAVENOUS

## 2019-08-23 MED ORDER — SODIUM CHLORIDE 0.9 % IV SOLN
INTRAVENOUS | Status: AC | PRN
  Administered 2019-08-23: 1000 mL via INTRAVENOUS

## 2019-08-27 MED FILL — Medication: Qty: 1 | Status: AC

## 2019-08-28 LAB — CULTURE, BLOOD (ROUTINE X 2)
Culture: NO GROWTH
Special Requests: ADEQUATE

## 2019-09-18 NOTE — Code Documentation (Signed)
TPA 50 mg administered IV right wrist 20 g by Angelina Pih RN by verbal order by Dr. Langston Masker.

## 2019-09-18 NOTE — Code Documentation (Signed)
Compressions continued

## 2019-09-18 NOTE — ED Triage Notes (Addendum)
Pt reports that she took kids trunk and treating on Saturday. Pt reports she started feeling bad on Monday. Reports coughing and SOB. EMS reports that pt O2 sat 88 on room air and 4 L O2 at [place] initiated with increase in sats. Pt dropped to 79 % when switched from EMS O2 to room O2. Pt adds that on Sunday night she was laying down and was going to take night time cold medicine and got  Strangled on medication

## 2019-09-18 NOTE — Code Documentation (Signed)
Patient time of death occurred at 74.

## 2019-09-18 NOTE — Code Documentation (Addendum)
Pulse check. No pulse. PEA per Dr. Langston Masker.

## 2019-09-18 NOTE — Code Documentation (Signed)
Pulse check. No pulse. Continue compressions.

## 2019-09-18 NOTE — Code Documentation (Signed)
Pulse Check   No pulse

## 2019-09-18 NOTE — Progress Notes (Signed)
Present with patient's family for emotional and spiritual support.

## 2019-09-18 NOTE — Progress Notes (Signed)
Responded to Cardiac Arrest. Pt bagged and intubated. Positive color change on Co2 detector. Bilateral equal breath sound. Suction small amount of thick pinkish, tan secretions. Pt bagged until TOD called

## 2019-09-18 NOTE — ED Provider Notes (Signed)
[facility] EMERGENCY DEPARTMENT Provider Note   CSN: [id number] Arrival date & time: 2019-08-25  1145     History   Chief Complaint Chief Complaint  Patient presents with   Shortness of Breath    HPI Nancy Lynn is a 43 y.o. female with a history of COPD (patient is not on oxygen at home but states she "needs some"), obesity, daily smoker, hypertension, presenting to the emergency department with shortness of breath.  She reports she took her kids trick-or-treating 5 days ago on Saturday.  Approximately 3 days ago on Monday. she began to have coughing and shortness of breath.  Her respiratory symptoms have progressively worsened up to today.  She describes malaise and diminished appetite as well.  She feels like she is working very hard to breathe.  She also feels like she is "sweating a lot and hot."  EMS reported that she was satting 88% on room air and with 4 L nasal cannula improved her sats into the low 90s.  On arrival in the ED per nursing triage, the patient dropped about 79% on room air.  She is now on 4 L nasal cannula.  Patient reports her fiance at home was sick with congestion type symptoms.  She reports that she currently feels like she is "burning up".  She denies any diarrhea.  She denies any prior history of intubation.   She reports her only drug allergies are morphine.  Denies antibiotic allergies.  Denies personal hx of blood clots, DVT or PE.      HPI  Past Medical History:  Diagnosis Date   Acute renal failure ([place]) 09/2012   due to polysubstance abuse   Chronic pain    COPD (chronic obstructive pulmonary disease) ([place])    Gall stones    Hypertension    Opiate addiction ([place])    stated she is in recovery but  "not an addict"   Polysubstance abuse [facility])     Patient Active Problem List   Diagnosis Date Noted   Polysubstance abuse ([place]) 09/24/2012   Hypotension 09/24/2012   Constipation 09/24/2012   ARF (acute renal failure) ([place])  09/23/2012   HTN (hypertension) 09/23/2012   Obesity 09/23/2012   Chest pain at rest 09/23/2012   COPD (chronic obstructive pulmonary disease) ([place]) 09/23/2012   Tobacco abuse 09/23/2012   KNEE PAIN, BILATERAL 11/18/2009    Past Surgical History:  Procedure Laterality Date   ABDOMINAL HYSTERECTOMY     CESAREAN SECTION     MOUTH SURGERY     TUBAL LIGATION       OB History   No obstetric history on file.      Home Medications    Prior to Admission medications   Medication Sig Start Date End Date Taking? Authorizing Provider  albuterol (PROVENTIL HFA;VENTOLIN HFA) 108 (90 BASE) MCG/ACT inhaler Inhale 1-2 puffs into the lungs every 6 (six) hours as needed for wheezing or shortness of breath. 09/17/13   Teressa Lower, MD  Buprenorphine HCl (SUBUTEX SL) Place 1 tablet under the tongue 3 (three) times daily.    [provider]  cephALEXin (KEFLEX) 500 MG capsule Take 500 mg by mouth 3 (three) times daily. 08/16/19   [provider]  clonazePAM (KLONOPIN) 1 MG tablet Take 1 mg by mouth 4 (four) times daily.    [provider]  clonazePAM (KLONOPIN) 2 MG tablet Take 2 mg by mouth at bedtime.    [provider]  COMBIVENT RESPIMAT 20-100 MCG/ACT AERS  respimat Inhale 2 puffs into the lungs 4 (four) times daily as needed. 07/23/19   [provider]  FLUoxetine (PROZAC) 20 MG capsule Take 20 mg by mouth daily. 07/23/19   [provider]  furosemide (LASIX) 40 MG tablet Take 40 mg by mouth 2 (two) times daily.    [provider]  gabapentin (NEURONTIN) 300 MG capsule Take 600 mg by mouth 3 (three) times daily.    [provider]  gabapentin (NEURONTIN) 800 MG tablet Take 800 mg by mouth 3 (three) times daily as needed. 05/29/19   [provider]  guaiFENesin (MUCINEX) 600 MG 12 hr tablet Take 1,200 mg by mouth 2 (two) times daily as needed for to loosen phlegm.    [provider]  ibuprofen  (ADVIL,MOTRIN) 200 MG tablet Take 800 mg by mouth every 6 (six) hours as needed. Cramps.    [provider]  metoprolol (LOPRESSOR) 50 MG tablet Take 50 mg by mouth 2 (two) times daily.    [provider]  ondansetron (ZOFRAN) 8 MG tablet Take 8 mg by mouth 3 (three) times daily as needed. 07/23/19   [provider]  phentermine (ADIPEX-P) 37.5 MG tablet Take 37.5 mg by mouth daily. 07/23/19   [provider]  phentermine 37.5 MG capsule Take 37.5 mg by mouth every morning.    [provider]  potassium chloride SA (K-DUR,KLOR-CON) 20 MEQ tablet Take 20 mEq by mouth 2 (two) times daily.    [provider]  SUBOXONE 8-2 MG FILM Place 3 Film under the tongue daily. 08/14/19   [provider]  tiZANidine (ZANAFLEX) 4 MG tablet Take 4 mg by mouth 3 (three) times daily as needed. 07/23/19   [provider]    Family History Family History  Problem Relation Age of Onset   Diabetes Mother    Hypertension Father     Social History Social History   Tobacco Use   Smoking status: Current Every Day Smoker    Packs/day: 1.00    Types: Cigarettes  Substance Use Topics   Alcohol use: No   Drug use: Not Currently    Types: Marijuana     Allergies   Latex and Morphine and related   Review of Systems Review of Systems  Constitutional: Positive for appetite change and fatigue. Negative for chills and fever.  Eyes: Negative for photophobia and visual disturbance.  Respiratory: Positive for cough, chest tightness and shortness of breath.   Cardiovascular: Positive for chest pain. Negative for palpitations.  Gastrointestinal: Positive for nausea. Negative for abdominal pain and vomiting.  Musculoskeletal: Positive for arthralgias and myalgias.  Skin: Negative for pallor and rash.  Neurological: Negative for syncope and speech difficulty.  All other systems reviewed and are negative.    Physical Exam Updated Vital  Signs BP (!) 157/109    Pulse (!) 0    Temp 100.2 F (37.9 C) (Oral)    Resp (!) 0    Ht _0  (1.676 m)    Wt (!) 158.7 kg    SpO2 (!) 0%    BMI 56.49 kg/m   Physical Exam Vitals signs and nursing note reviewed.  Constitutional:      General: She is not in acute distress.    Appearance: She is well-developed. She is obese.  HENT:     Head: Normocephalic and atraumatic.  Eyes:     Conjunctiva/sclera: Conjunctivae normal.  Neck:     Musculoskeletal: Neck supple.  Cardiovascular:  Rate and Rhythm: Normal rate and regular rhythm.     Heart sounds: No murmur.  Pulmonary:     Effort: Accessory muscle usage present.     Comments: Crackles diffusely on pulmonary exam Sating 95% on 4L [place] Abdominal:     Palpations: Abdomen is soft.     Tenderness: There is no abdominal tenderness.  Skin:    General: Skin is warm and dry.  Neurological:     Mental Status: She is alert.      ED Treatments / Results  Labs (all labs ordered are listed, but only abnormal results are displayed) Labs Reviewed  COMPREHENSIVE METABOLIC PANEL - Abnormal; Notable for the following components:      Result Value   Glucose, Bld 101 (*)    Calcium 8.6 (*)    Total Bilirubin 1.9 (*)    All other components within normal limits  CBC WITH DIFFERENTIAL/PLATELET - Abnormal; Notable for the following components:   WBC 21.0 (*)    RBC 5.25 (*)    RDW 15.6 (*)    Neutro Abs 17.2 (*)    Monocytes Absolute 1.1 (*)    Abs Immature Granulocytes 0.15 (*)    All other components within normal limits  BRAIN NATRIURETIC PEPTIDE - Abnormal; Notable for the following components:   B Natriuretic Peptide 359.0 (*)    All other components within normal limits  CULTURE, BLOOD (ROUTINE X 2)  SARS CORONAVIRUS 2 (TAT 6-24 HRS)  LACTIC ACID, PLASMA  APTT  PROTIME-INR  PROCALCITONIN    EKG None  Radiology Dg Chest Portable 1 View  Result Date: August 26, 2019 CLINICAL DATA:  Cough and shortness of breath EXAM:  PORTABLE CHEST 1 VIEW COMPARISON:  September 17, 2013 FINDINGS: There is extensive airspace opacity bilaterally with multiple nodular appearing areas throughout the lungs diffusely. There is cardiomegaly with pulmonary venous hypertension. No adenopathy is appreciable. No effusions evident. No bone lesions. IMPRESSION: Extensive airspace opacity bilaterally. Multiple nodular appearing areas noted bilaterally. Question diffuse pneumonia versus possible metastatic disease given the nodular appearance of the opacities present. Both entities could be present concurrently. This finding may warrant chest CT to further assess. Certainly a clinical history of underlying neoplasm could be helpful for further assessment in this regard. Heart mildly enlarged with an apparent degree of pulmonary vascular congestion. No adenopathy demonstrable. Electronically Signed   By: Lowella Grip III M.D.   On: 26-Aug-2019 12:38    Procedures .Critical Care Performed by: Wyvonnia Dusky, MD Authorized by: Wyvonnia Dusky, MD   Critical care provider statement:    Critical care time (minutes):  60   Critical care was necessary to treat or prevent imminent or life-threatening deterioration of the following conditions:  Circulatory failure and respiratory failure   Critical care was time spent personally by me on the following activities:  Discussions with consultants, evaluation of patient's response to treatment, examination of patient, ordering and performing treatments and interventions, ordering and review of laboratory studies, ordering and review of radiographic studies, pulse oximetry, re-evaluation of patient's condition, obtaining history from patient or surrogate and review of old charts Comments:     PEA arrest requiring multiple rounds of IV medications and IV tPA to be administered for suspected PE Additional time spent in discussion with family Procedure Name: Intubation Date/Time: 08/26/2019 4:22  PM Performed by: Wyvonnia Dusky, MD Pre-anesthesia Checklist: Patient identified, Patient being monitored, Emergency Drugs available and Suction available Ventilation: Mask ventilation with difficulty and Two handed mask  ventilation required Laryngoscope Size: Mac and 3 Tube size: 7.5 mm Number of attempts: 2 Airway Equipment and Method: Video-laryngoscopy Placement Confirmation: ETT inserted through vocal cords under direct vision,  CO2 detector and Breath sounds checked- equal and bilateral Secured at: 23 cm Tube secured with: ETT holder Difficulty Due To: Difficult Airway- due to limited oral opening Comments: Emergent intubation during PEA arrest      (including critical care time)  Medications Ordered in ED Medications  EPINEPHrine (ADRENALIN) 1 MG/10ML injection (1 mg Intravenous Given 09-09-2019 1304)  EPINEPHrine (ADRENALIN) 1 MG/10ML injection (1 mg Intravenous Given 09-Sep-2019 1309)  0.9 %  sodium chloride infusion ( Intravenous Stopping Infusion hung by another clincian 2019/09/09 1916)     Initial Impression / Assessment and Plan / ED Course  I have reviewed the triage vital signs and the nursing notes.  Pertinent labs & imaging results that were available during my care of the patient were reviewed by me and considered in my medical decision making (see chart for details).  43 yo female who presented with 3 days of progressively worsening respiratory distress.  Found to be hypoxic requiring 4L [place].  Some accessory muscle usage on exam, but able to speak in full sentences.  She has diffuse crackles on lung exam, and appears diaphoretic.  This is concerning for infectious etiology (covid pna possible given her sick contact at home) vs. Bacterial PNA vs. PE vs. ACS vs. New onset CHF vs. Other  No wheezing on exam to suggest COPD flare but will check VBG Troponin and ECG pending PE remains on differential, may need CT scan if immediate workup is unremarkable   Clinical Course  as of Aug 23 705  Thu Aug 23, 2019  1236 Per my interpretation of the chest x-ray she has diffuse patchy bilateral infiltrates consistent with infectious or inflammatory process, in this clinical setting also consistent with COVID-19.   [MT]  1340 I was summoned to the bedside for CODE BLUE at approximately 12:55 PM.  The patient's nurse reports that the patient suddenly became extremely agitated making clawing and grabbing at his shirt and at her railings.  He says she immediately began to desaturate and turned blue around the lips.  By the time I arrived to the room the patient had no pulses, and chest compressions were immediately begun.  She was given a dose of IV epinephrine through an IV in her right AC.  There was an unsuccessful attempt to place an additional IO line in her right tibia.  On her first rhythm check she was found to be in PEA arrest.  Compressions were resumed and she was eventually transitioned to the [facility] compression device.  The patient was being bagged for ventilation from the beginning of the code.  I subsequently intubated the patient with a 7-1/2 [place] tube using a glide scope.  We were able to bag her easily through the ET tube.  Collectively this raised my concern for a pulmonary embolism as a cause of her symptoms.  TPA was given with a 50 mg bolus followed by 50 mg infusion started.  Multiple rounds of epinephrine were administered throughout.  Patient remained in PEA arrest throughout all this.  After approximately 35 minutes of continuous compressions PEA arrest, I performed a bedside echocardiogram, which found the patient in cardiac standstill.  She was declared deceased at 13:35.   [MT]  9381 ME declines case.  I have now informed the patient's immediate family members including her mother  who is present in the ED.  They are considering an autopsy but want some time to discuss this.  The patient unfortunately remains PUI and may not be eligible for autopsy. This was  explained to them, and all of their questions were answered to the best of my ability.   [MT]  [facility] staff contacted organ donation service who refused case.  I have filled out death certificate.  I was unable to discover the patient's PCP per our records.   [MT]    Clinical Course User Index [MT] [place] Huberty, Carola Rhine, MD    Nancy Lynn was evaluated in Emergency Department on 08/24/2019 for the symptoms described in the history of present illness. She was evaluated in the context of the global COVID-19 pandemic, which necessitated consideration that the patient might be at risk for infection with the SARS-CoV-2 virus that causes COVID-19. Institutional protocols and algorithms that pertain to the evaluation of patients at risk for COVID-19 are in a state of rapid change based on information released by regulatory bodies including the CDC and federal and state organizations. These policies and algorithms were followed during the patient's care in the ED.   Death Note:  I was present at the bedside to pronounce Nancy Lynn. On physical exam, patient had no spontaneous respirations and was unresponsive to auditory or tactile stimuli. She had no cardiac sounds on auscultation and no carotid pulse. Pupils were fixed and dilated with no response to light.   Family requests autopsy.  Case not appropriate for medical examiner.  Organ donation service to be contacted by nursing staff.  Patient is under investigation for COVID-19.  Time of Death: [place]   Final Clinical Impressions(s) / ED Diagnoses   Final diagnoses:  Cardiac arrest [facility])  Hypoxia    ED Discharge Orders    None       Wyvonnia Dusky, MD 08/24/19 [phone]

## 2019-09-18 NOTE — Code Documentation (Addendum)
Pulse check. No pulse.  [place]as chest compressor applied and aligned by this nurse.

## 2019-09-18 NOTE — ED Notes (Addendum)
Patient cleaned and belongings placed in belongings bag.

## 2019-09-18 NOTE — Code Documentation (Signed)
Pulse check. No pulse.

## 2019-09-18 NOTE — ED Notes (Signed)
This nurse enetered patient's room to help patient from bedside toilet to bed. Patient stated that she couldn't breath and patient was helped to bed and placed back on O@ via [place] at 4L/min. Patient began to panic stating that, "I can't breath."   This nurse noticed cyanosis around lips and activated Code in room.

## 2019-09-18 DEATH — deceased

## 2021-04-29 IMAGING — DX DG CHEST 1V PORT
1 series · 1 of 1 positions shown · non-contrast
Comparison: September 17, 2013

CLINICAL DATA: Cough and shortness of breath

EXAM:
PORTABLE CHEST 1 VIEW

[chest ap]
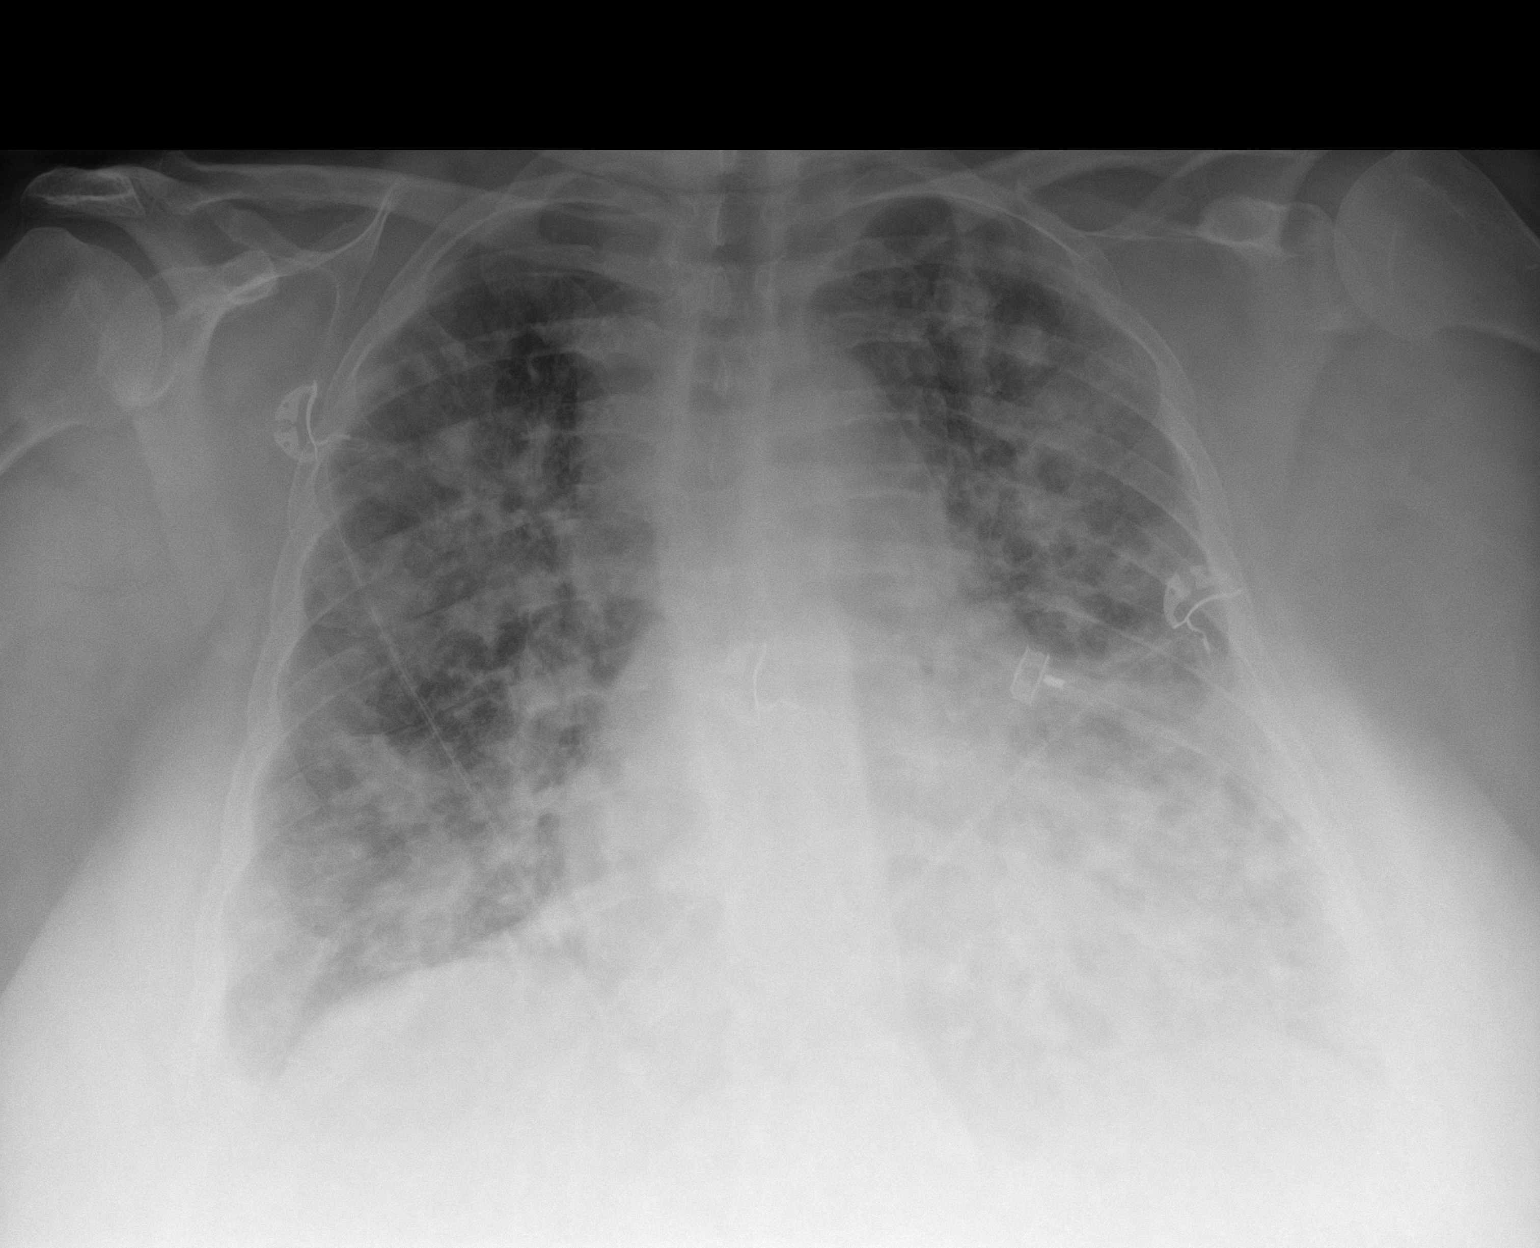

[1 of 1 positions shown; findings below may reference images not displayed]

FINDINGS: There is extensive airspace opacity bilaterally with multiple
nodular appearing areas throughout the lungs diffusely. There is
cardiomegaly with pulmonary venous hypertension. No adenopathy is
appreciable. No effusions evident. No bone lesions.
IMPRESSION: Extensive airspace opacity bilaterally. Multiple nodular appearing
areas noted bilaterally. Question diffuse pneumonia versus possible
metastatic disease given the nodular appearance of the opacities
present. Both entities could be present concurrently. This finding
may warrant chest CT to further assess. Certainly a clinical history
of underlying neoplasm could be helpful for further assessment in
this regard.

Heart mildly enlarged with an apparent degree of pulmonary vascular
congestion. No adenopathy demonstrable.
